# Patient Record
Sex: Male | Born: 1955 | Race: White | Hispanic: No | Smoking: Light tobacco smoker
Health system: Southern US, Community
[De-identification: ages and names within clinical notes are randomized; demographics above are authoritative.]

## PROBLEM LIST (undated history)

## (undated) DIAGNOSIS — R519 Headache, unspecified: Secondary | ICD-10-CM

## (undated) DIAGNOSIS — R809 Proteinuria, unspecified: Secondary | ICD-10-CM

## (undated) DIAGNOSIS — F32A Depression, unspecified: Secondary | ICD-10-CM

## (undated) DIAGNOSIS — R51 Headache: Secondary | ICD-10-CM

## (undated) DIAGNOSIS — T8859XA Other complications of anesthesia, initial encounter: Secondary | ICD-10-CM

## (undated) DIAGNOSIS — M1990 Unspecified osteoarthritis, unspecified site: Secondary | ICD-10-CM

## (undated) DIAGNOSIS — E785 Hyperlipidemia, unspecified: Secondary | ICD-10-CM

## (undated) DIAGNOSIS — J349 Unspecified disorder of nose and nasal sinuses: Secondary | ICD-10-CM

## (undated) DIAGNOSIS — D472 Monoclonal gammopathy: Secondary | ICD-10-CM

## (undated) DIAGNOSIS — T4145XA Adverse effect of unspecified anesthetic, initial encounter: Secondary | ICD-10-CM

## (undated) DIAGNOSIS — M47816 Spondylosis without myelopathy or radiculopathy, lumbar region: Secondary | ICD-10-CM

## (undated) DIAGNOSIS — I10 Essential (primary) hypertension: Secondary | ICD-10-CM

## (undated) DIAGNOSIS — K219 Gastro-esophageal reflux disease without esophagitis: Secondary | ICD-10-CM

## (undated) DIAGNOSIS — H9190 Unspecified hearing loss, unspecified ear: Secondary | ICD-10-CM

## (undated) DIAGNOSIS — M47812 Spondylosis without myelopathy or radiculopathy, cervical region: Secondary | ICD-10-CM

## (undated) DIAGNOSIS — R112 Nausea with vomiting, unspecified: Secondary | ICD-10-CM

## (undated) DIAGNOSIS — C801 Malignant (primary) neoplasm, unspecified: Secondary | ICD-10-CM

## (undated) DIAGNOSIS — E119 Type 2 diabetes mellitus without complications: Secondary | ICD-10-CM

## (undated) DIAGNOSIS — G4730 Sleep apnea, unspecified: Secondary | ICD-10-CM

## (undated) DIAGNOSIS — Z9889 Other specified postprocedural states: Secondary | ICD-10-CM

## (undated) HISTORY — PX: ANKLE ARTHROSCOPY: SUR85

## (undated) HISTORY — PX: TONSILLECTOMY: SUR1361

## (undated) HISTORY — PX: HERNIA REPAIR: SHX51

## (undated) HISTORY — PX: EYE SURGERY: SHX253

## (undated) HISTORY — DX: Monoclonal gammopathy: D47.2

## (undated) HISTORY — PX: PLANTAR FASCIECTOMY: SUR600

## (undated) HISTORY — PX: SINUSOTOMY: SHX291

---

## 1998-05-27 ENCOUNTER — Ambulatory Visit: Admission: RE | Admit: 1998-05-27 | Discharge: 1998-05-27 | Payer: Self-pay | Admitting: Otolaryngology

## 2005-01-16 ENCOUNTER — Ambulatory Visit: Payer: Self-pay | Admitting: General Surgery

## 2005-02-07 ENCOUNTER — Ambulatory Visit: Payer: Self-pay | Admitting: General Surgery

## 2007-10-30 ENCOUNTER — Ambulatory Visit: Payer: Self-pay | Admitting: Internal Medicine

## 2008-06-29 ENCOUNTER — Emergency Department: Payer: Self-pay | Admitting: Internal Medicine

## 2008-06-29 IMAGING — CR DG WRIST COMPLETE 3+V*R*
1 series · 4 of 4 positions shown · non-contrast
Comparison: None

REASON FOR EXAM: Laceration with Small particle of glass
COMMENTS:

PROCEDURE:     DXR - DXR WRIST RT COMP WITH OBLIQUES  - [DATE]  [DATE]
RESULT:     History: Laceration

[Series 1: view not recorded · 0.17mm/px · 4 of 4 slices shown]
[im 1/4]
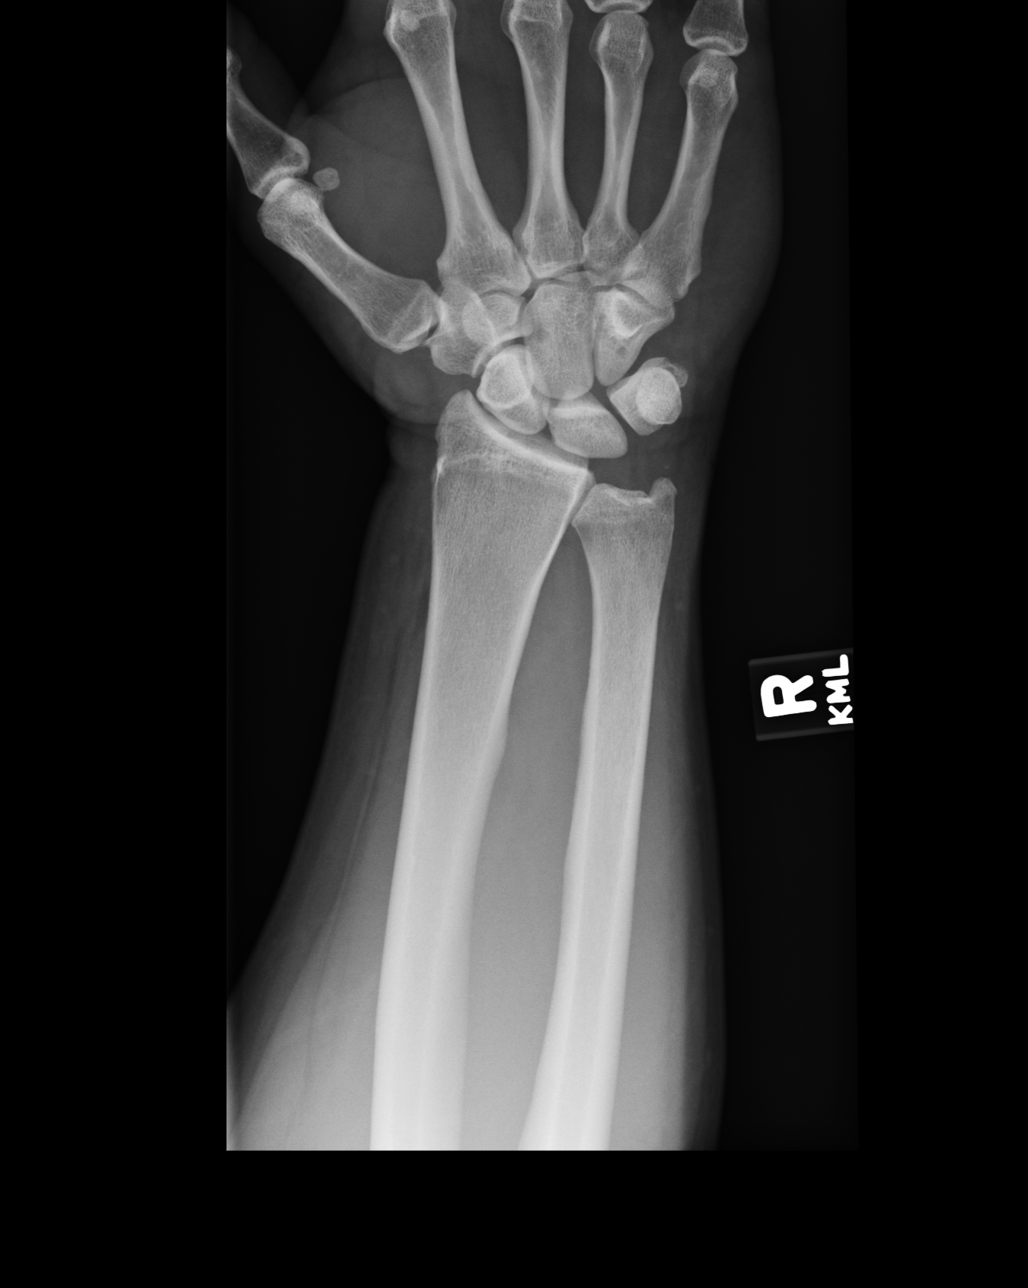
[im 2/4]
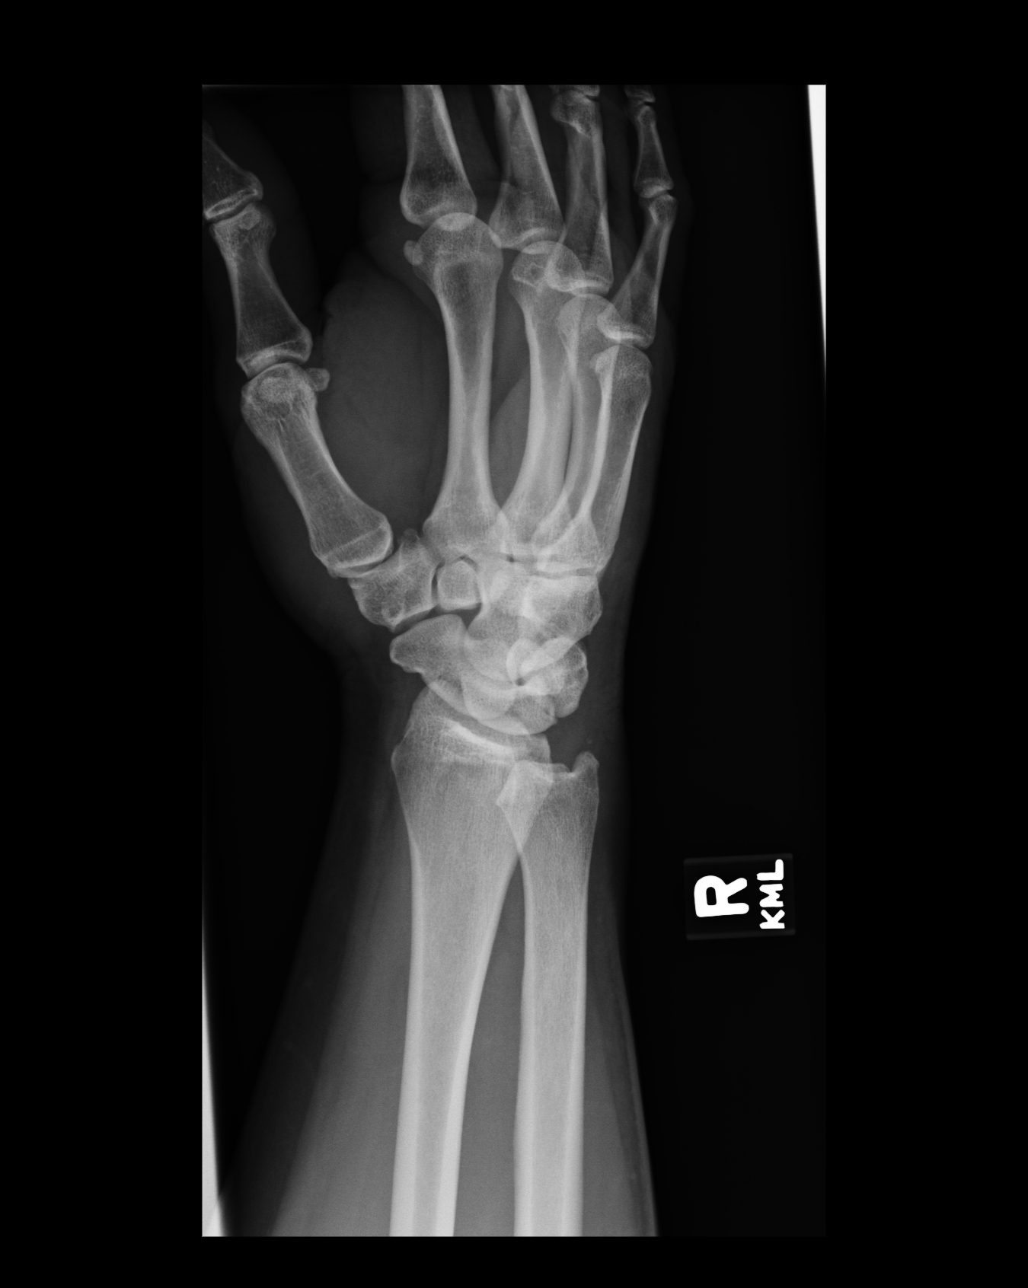
[im 3/4]
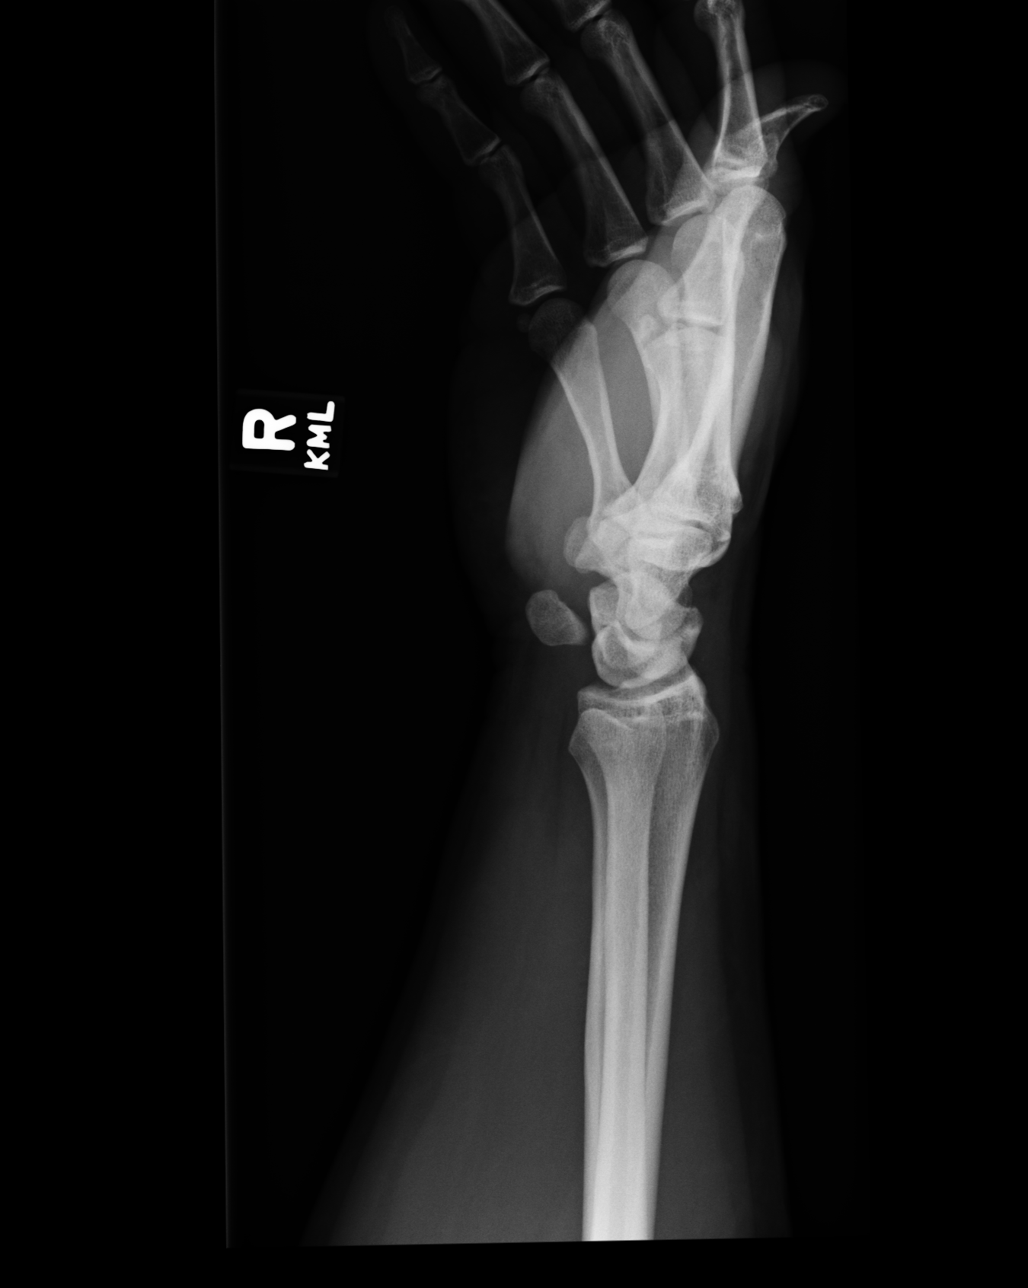
[im 4/4]
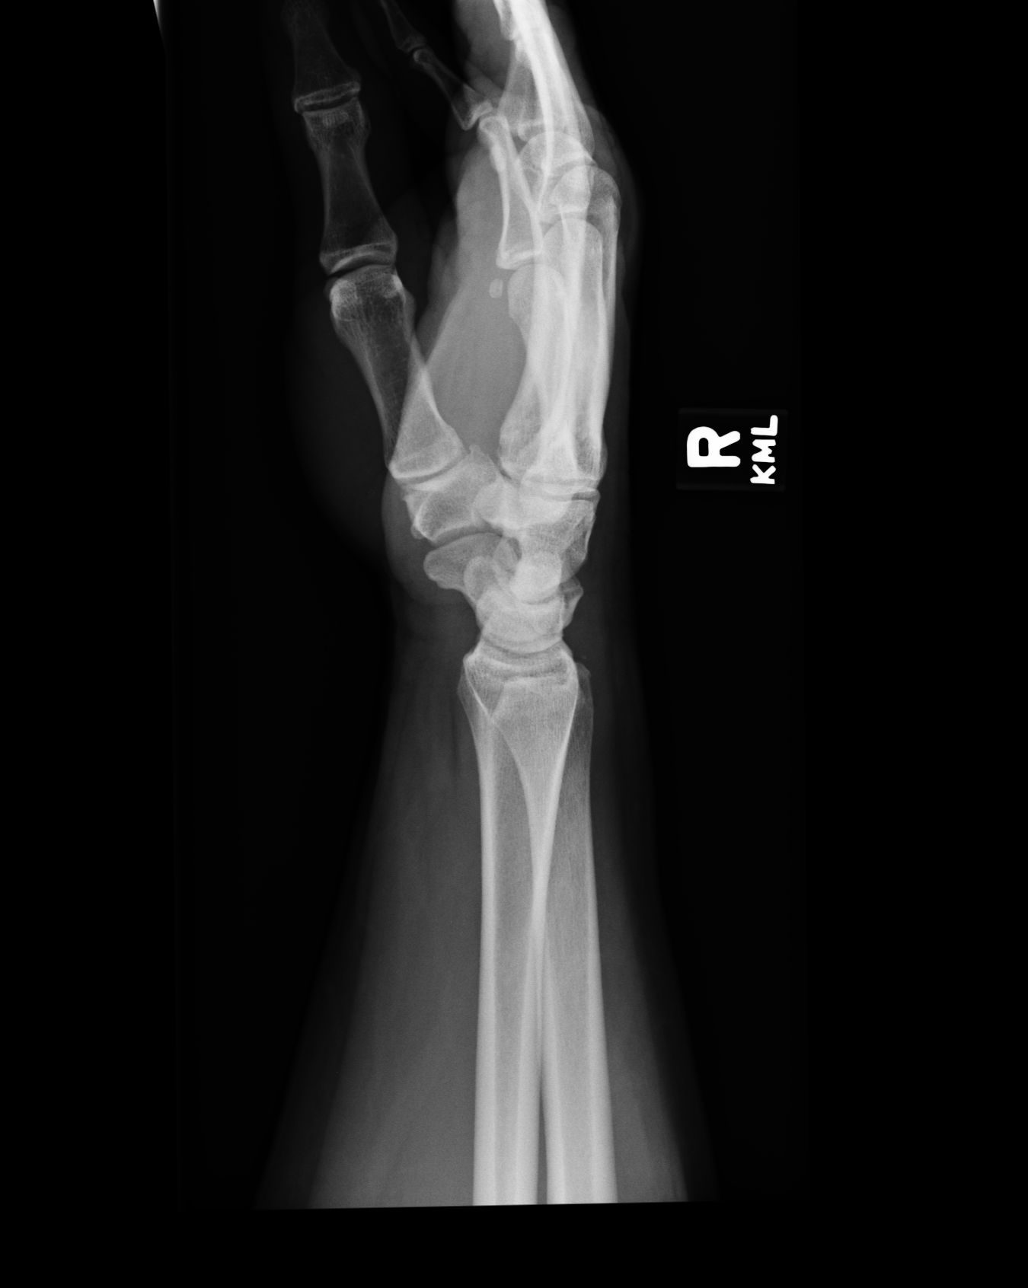

[4 of 4 positions shown; findings below may reference images not displayed]

FINDINGS: 4 views of the right wrist demonstrate no acute fracture or dislocation. The
joint spaces are maintained. The soft tissues appear normal.
IMPRESSION: No acute osseous abnormality of the right wrist.

If there is clinical concern regarding a radiographically occult scaphoid
fracture, snuff box tenderness, or ligamentous injury, further assessment
with MRI is recommended.

## 2009-12-10 ENCOUNTER — Ambulatory Visit: Payer: Self-pay | Admitting: Unknown Physician Specialty

## 2009-12-21 ENCOUNTER — Ambulatory Visit: Payer: Self-pay | Admitting: Unknown Physician Specialty

## 2010-01-21 HISTORY — PX: SINUSOTOMY: SHX291

## 2011-01-19 ENCOUNTER — Ambulatory Visit: Payer: Self-pay | Admitting: Gastroenterology

## 2011-01-22 HISTORY — PX: COLON SURGERY: SHX602

## 2011-02-01 ENCOUNTER — Ambulatory Visit: Payer: Self-pay | Admitting: Surgery

## 2011-02-01 DIAGNOSIS — I10 Essential (primary) hypertension: Secondary | ICD-10-CM

## 2011-02-07 ENCOUNTER — Inpatient Hospital Stay: Payer: Self-pay | Admitting: Surgery

## 2011-02-08 DIAGNOSIS — C189 Malignant neoplasm of colon, unspecified: Secondary | ICD-10-CM | POA: Insufficient documentation

## 2011-02-21 ENCOUNTER — Ambulatory Visit: Payer: Self-pay | Admitting: Internal Medicine

## 2011-02-24 LAB — CEA: CEA: 2.9 ng/mL (ref 0.0–4.7)

## 2011-02-27 LAB — PATHOLOGY REPORT

## 2011-02-28 IMAGING — CT CT CHEST-ABD-PELV W/ CM
1 of 2 series · 14 of 32 positions shown, 18 images · non-contrast
Comparison: none

REASON FOR EXAM: inital staging colon CA
COMMENTS:

[Series 2: ch-ab-pel w 5.0 i40f 3 · axial · 0.98mm/px · z∈[-324,+320]mm · 14 of 143 slices shown, 18 images]
[im 7/143  soft-tissue]
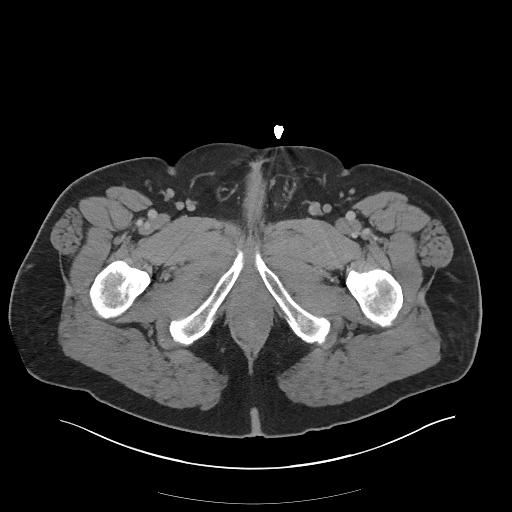
[im 7/143  bone]
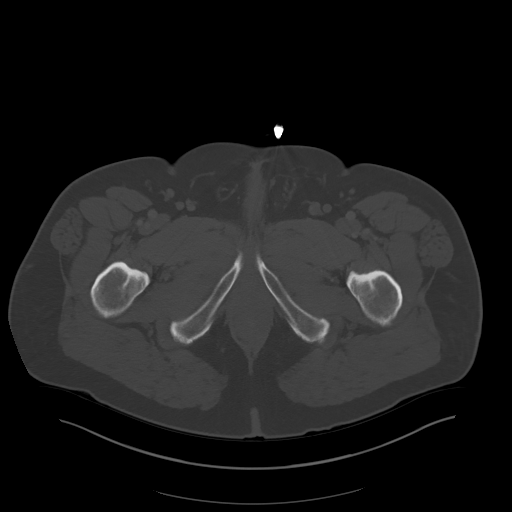
[im 21/143  soft-tissue]
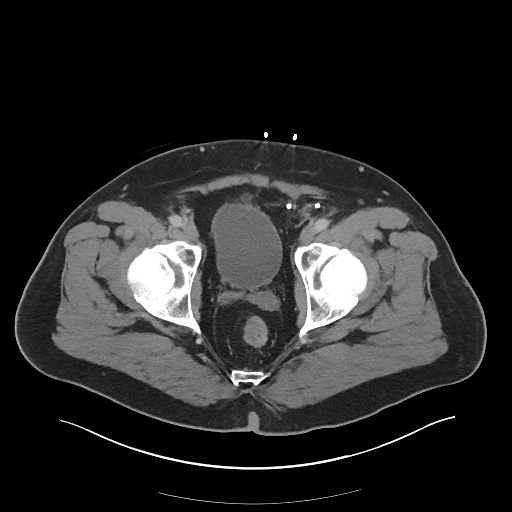
[im 34/143  soft-tissue]
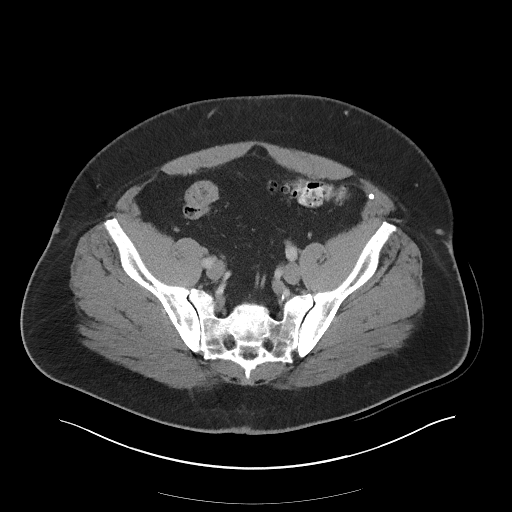
[im 41/143  soft-tissue]
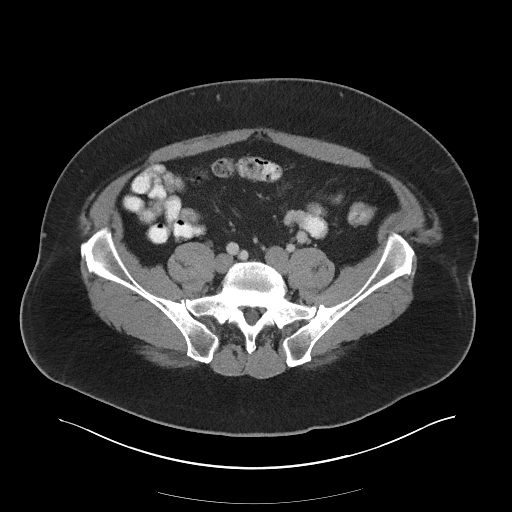
[im 55/143  soft-tissue]
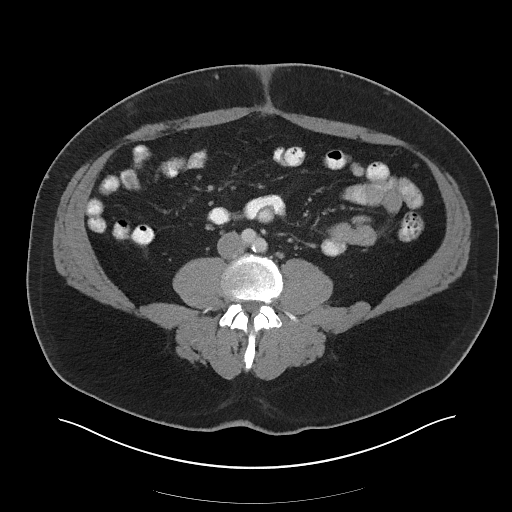
[im 68/143  soft-tissue]
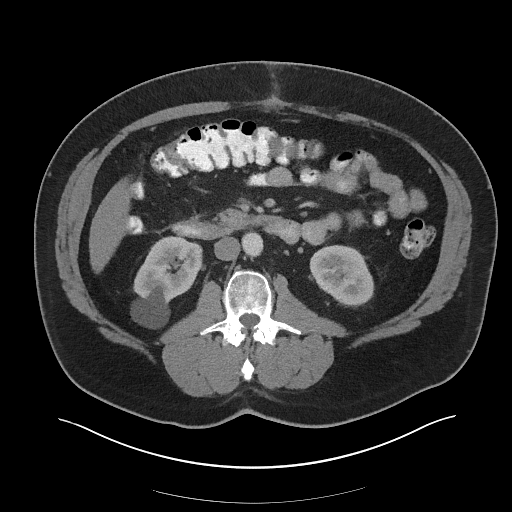
[im 75/143  soft-tissue]
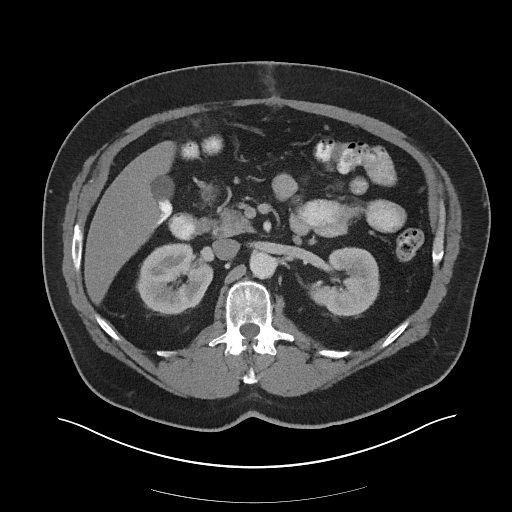
[im 88/143  soft-tissue]
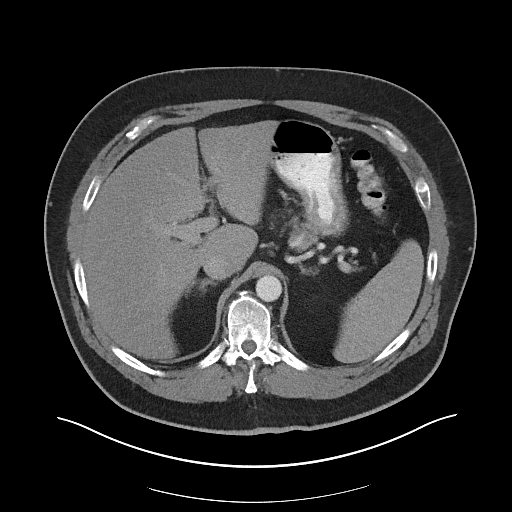
[im 102/143  soft-tissue]
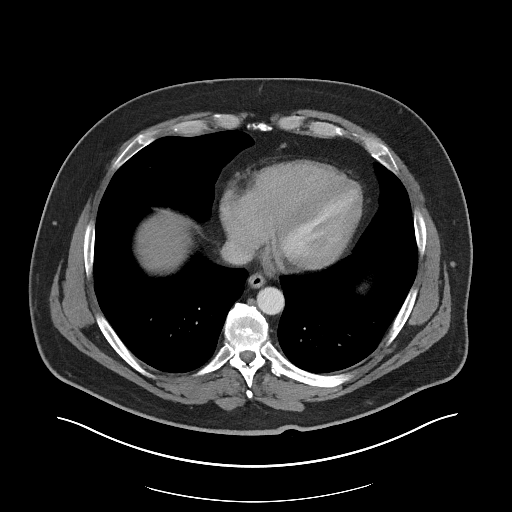
[im 102/143  bone]
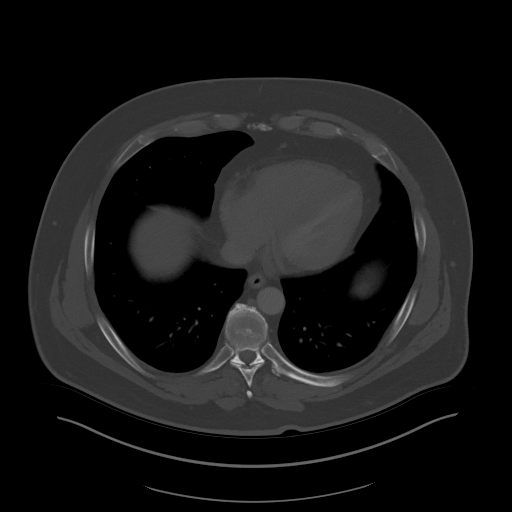
[im 109/143  soft-tissue]
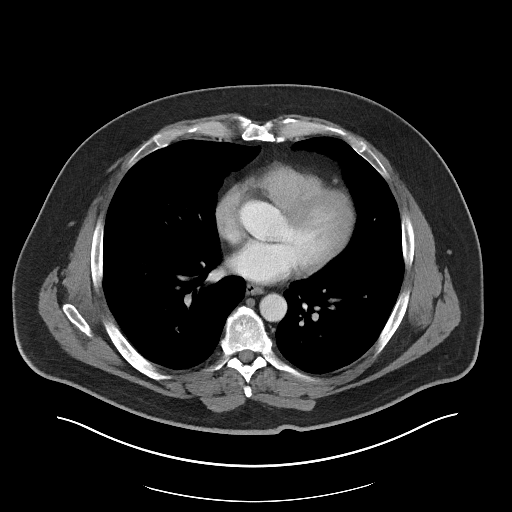
[im 115/143  lung]
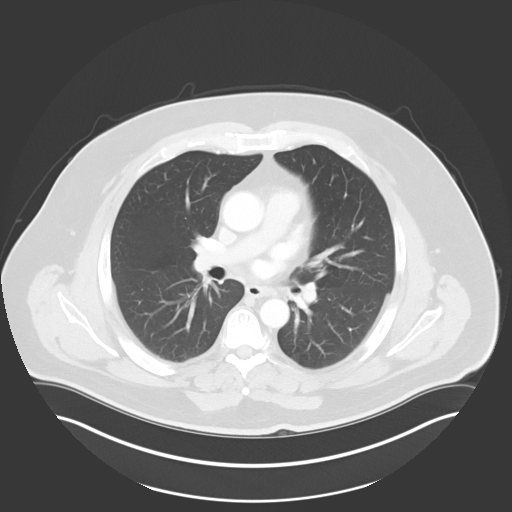
[im 122/143  soft-tissue]
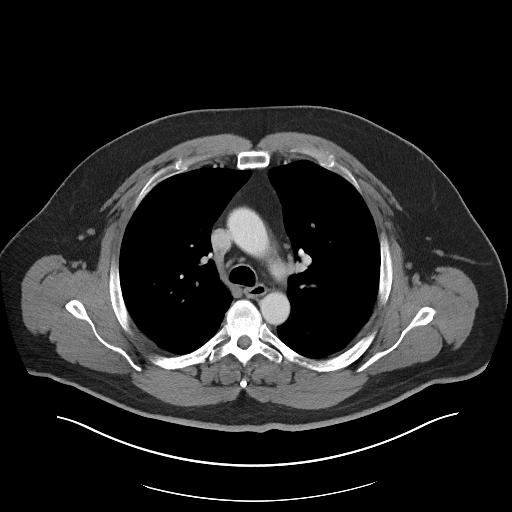
[im 122/143  lung]
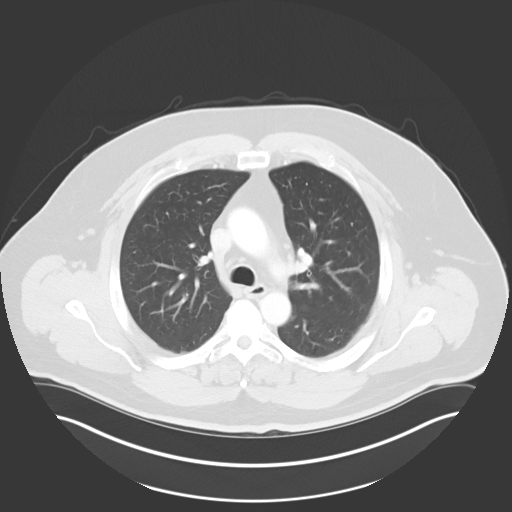
[im 129/143  lung]
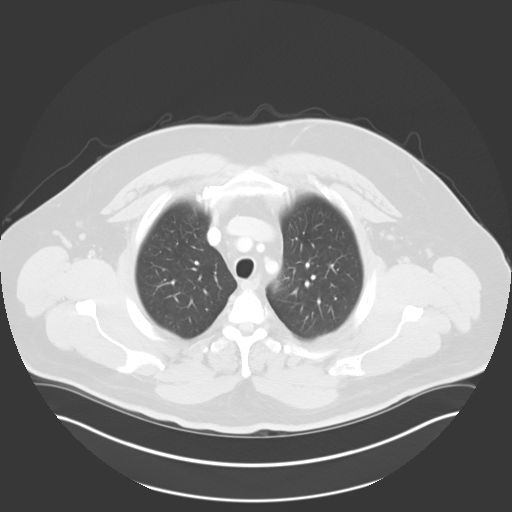
[im 136/143  soft-tissue]
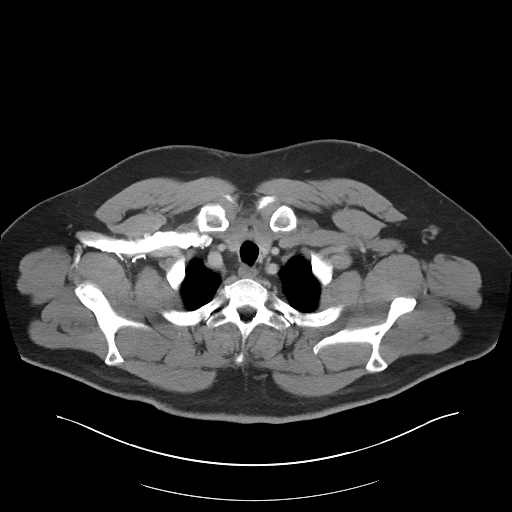
[im 136/143  lung]
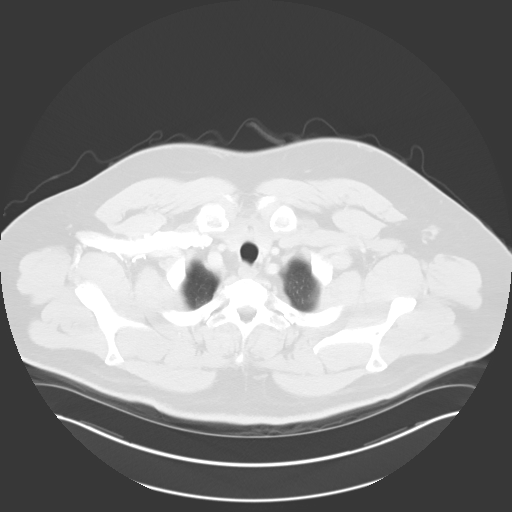

[14 of 32 positions shown; findings below may reference images not displayed]

PROCEDURE:     KCT - KCT CHEST ABDOMEN AND PELVIS W  - [DATE]  [DATE]

RESULT:     Axial CT scanning was performed through the chest, abdomen, and
pelvis at 5 mm intervals and slice thicknesses. The patient received 100 cc
of [IH] as well as oral contrast material. Review of multiplanar
reconstructed images was performed separately on the VIA monitor.

CT scan of the chest: At lung window settings I see no interstitial nor
alveolar infiltrates. No pulmonary parenchymal nodules or masses are
demonstrated. And mediastinal window settings the cardiac chambers are
normal in size. The caliber of the thoracic aorta is normal. I see no
pathologic sized mediastinal or hilar or axillary lymph nodes. The thyroid
lobes are normal in density and symmetric in size. The trachea is midline.
The thoracic esophagus is normal in appearance. There is no pleural nor
pericardial effusion. The thoracic vertebral bodies are preserved in height.
No lytic or blastic rib lesion is identified.
CONCLUSION: I see no finding to suggest metastatic disease the thorax.

CT scan of the abdomen and pelvis: The liver exhibits normal density with no
focal mass nor ductal dilation. The gallbladder is adequately distended with
no evidence of calcified stones or wall thickening or pericholecystic fluid.
The pancreas, spleen, partially distended stomach, adrenal glands, and
kidneys exhibit no acute abnormality. The right kidney exhibits a 1 cm
diameter cortically based anterior upper pole hypodensity consistent with a
cyst. Exophytic from the posterior aspect of the midpole is an approximately
3 0.9 cm diameter hypodensity with Hounsfield measurement of -11 most
compatible with a cyst.

The caliber of the abdominal aorta is normal. The periaortic and pericaval
regions exhibit no of lymphadenopathy. The partially contrast-filled loops
of small and large bowel exhibit no acute abnormality. There is no evidence
of bowel obstruction or ileus. There are surgical clips in the right mid
abdomen and evidence of previous right colectomy. There is a tiny umbilical
hernia containing only fat. The sigmoid colon contains none of the orally
administered contrast, but it is grossly normal. The patient has undergone
previous left inguinal hernia repair. The urinary bladder and prostate gland
and seminal vesicles are normal in appearance. The lumbar vertebral bodies
are preserved in height. There is disc space narrowing at L4-L5. The bony
pelvis exhibits no lytic or blastic lesion.
IMPRESSION: 1. I see no finding to suggest metastatic disease to the thorax.
2. I see no finding suspicious for direct extension of colonic malignancy
nor of metastatic disease.
3. There are hypodensities associated with the right kidney most compatible
with cysts.

## 2011-03-24 ENCOUNTER — Ambulatory Visit: Payer: Self-pay | Admitting: Internal Medicine

## 2011-07-04 ENCOUNTER — Ambulatory Visit: Payer: Self-pay | Admitting: Internal Medicine

## 2011-07-24 ENCOUNTER — Ambulatory Visit: Payer: Self-pay | Admitting: Internal Medicine

## 2011-11-03 ENCOUNTER — Ambulatory Visit: Payer: Self-pay | Admitting: Internal Medicine

## 2011-11-03 LAB — COMPREHENSIVE METABOLIC PANEL
Anion Gap: 8 (ref 7–16)
Calcium, Total: 8.8 mg/dL (ref 8.5–10.1)
Chloride: 103 mmol/L (ref 98–107)
Co2: 29 mmol/L (ref 21–32)
EGFR (African American): >60
EGFR (Non-African Amer.): >60
Osmolality: 284 (ref 275–301)
Potassium: 3.9 mmol/L (ref 3.5–5.1)
Sodium: 140 mmol/L (ref 136–145)

## 2011-11-03 LAB — CBC CANCER CENTER
Basophil %: 0.4 %
Eosinophil #: 0.2 x10 3/mm (ref 0.0–0.7)
Eosinophil %: 2.4 %
HCT: 45.2 % (ref 40.0–52.0)
HGB: 15.7 g/dL (ref 13.0–18.0)
Lymphocyte %: 32 %
MCH: 33.1 pg (ref 26.0–34.0)
Neutrophil #: 3.7 x10 3/mm (ref 1.4–6.5)
Neutrophil %: 54.9 %
RBC: 4.75 10*6/uL (ref 4.40–5.90)

## 2011-11-24 ENCOUNTER — Ambulatory Visit: Payer: Self-pay | Admitting: Internal Medicine

## 2012-03-01 ENCOUNTER — Ambulatory Visit: Payer: Self-pay | Admitting: Oncology

## 2012-03-01 LAB — CBC CANCER CENTER
Eosinophil #: 0.2 x10 3/mm (ref 0.0–0.7)
HCT: 46.6 % (ref 40.0–52.0)
HGB: 15.7 g/dL (ref 13.0–18.0)
Lymphocyte #: 2.2 x10 3/mm (ref 1.0–3.6)
Lymphocyte %: 31.4 %
MCHC: 33.7 g/dL (ref 32.0–36.0)
MCV: 96 fL (ref 80–100)
Monocyte %: 10.8 %
Neutrophil %: 54.4 %
Platelet: 187 x10 3/mm (ref 150–440)
RDW: 12.8 % (ref 11.5–14.5)
WBC: 7 x10 3/mm (ref 3.8–10.6)

## 2012-03-01 LAB — COMPREHENSIVE METABOLIC PANEL
Albumin: 3.9 g/dL (ref 3.4–5.0)
Calcium, Total: 8.9 mg/dL (ref 8.5–10.1)
Co2: 28 mmol/L (ref 21–32)
EGFR (African American): >60
EGFR (Non-African Amer.): >60
Glucose: 166 mg/dL — ABNORMAL HIGH (ref 65–99)
Osmolality: 286 (ref 275–301)
Potassium: 3.4 mmol/L — ABNORMAL LOW (ref 3.5–5.1)
SGOT(AST): 31 U/L (ref 15–37)
SGPT (ALT): 51 U/L
Sodium: 140 mmol/L (ref 136–145)

## 2012-03-03 LAB — CEA: CEA: 3.2 ng/mL (ref 0.0–4.7)

## 2012-03-23 ENCOUNTER — Ambulatory Visit: Payer: Self-pay | Admitting: Oncology

## 2012-05-16 ENCOUNTER — Ambulatory Visit: Payer: Self-pay | Admitting: Gastroenterology

## 2012-05-20 LAB — PATHOLOGY REPORT

## 2012-07-05 ENCOUNTER — Ambulatory Visit: Payer: Self-pay | Admitting: Oncology

## 2012-07-05 LAB — CBC CANCER CENTER
Basophil #: 0.1 x10 3/mm (ref 0.0–0.1)
Basophil %: 0.6 %
Eosinophil #: 0.1 x10 3/mm (ref 0.0–0.7)
Eosinophil %: 1.6 %
HCT: 45.6 % (ref 40.0–52.0)
HGB: 15.1 g/dL (ref 13.0–18.0)
Lymphocyte %: 26.1 %
MCH: 31.9 pg (ref 26.0–34.0)
MCHC: 33.1 g/dL (ref 32.0–36.0)
MCV: 97 fL (ref 80–100)
Monocyte #: 0.8 x10 3/mm (ref 0.2–1.0)
Neutrophil #: 5.7 x10 3/mm (ref 1.4–6.5)
Neutrophil %: 62.5 %
RBC: 4.72 10*6/uL (ref 4.40–5.90)
RDW: 12.7 % (ref 11.5–14.5)
WBC: 9.1 x10 3/mm (ref 3.8–10.6)

## 2012-07-23 ENCOUNTER — Ambulatory Visit: Payer: Self-pay | Admitting: Oncology

## 2013-01-06 ENCOUNTER — Ambulatory Visit: Payer: Self-pay | Admitting: Oncology

## 2013-01-16 LAB — CBC CANCER CENTER
Basophil #: 0.1 "x10 3/mm "
Basophil %: 0.8 %
Eosinophil #: 0.2 "x10 3/mm "
Eosinophil %: 2.2 %
HCT: 44.4 %
HGB: 15.3 g/dL
Lymphocyte %: 32.6 %
Lymphs Abs: 2.7 "x10 3/mm "
MCH: 32.2 pg
MCHC: 34.5 g/dL
MCV: 94 fL
Monocyte #: 0.8 "x10 3/mm "
Monocyte %: 9.2 %
Neutrophil #: 4.5 "x10 3/mm "
Neutrophil %: 55.2 %
Platelet: 174 "x10 3/mm "
RBC: 4.75 "x10 6/mm "
RDW: 12.8 %
WBC: 8.2 "x10 3/mm "

## 2013-01-17 LAB — CEA: CEA: 2.9 ng/mL (ref 0.0–4.7)

## 2013-01-21 ENCOUNTER — Ambulatory Visit: Payer: Self-pay | Admitting: Oncology

## 2013-07-10 ENCOUNTER — Ambulatory Visit: Payer: Self-pay | Admitting: Oncology

## 2013-07-10 LAB — CBC CANCER CENTER
Basophil #: 0.1 x10 3/mm (ref 0.0–0.1)
Eosinophil #: 0.2 x10 3/mm (ref 0.0–0.7)
Eosinophil %: 1.9 %
HCT: 46.4 % (ref 40.0–52.0)
HGB: 15.8 g/dL (ref 13.0–18.0)
MCH: 31.9 pg (ref 26.0–34.0)
MCHC: 34.1 g/dL (ref 32.0–36.0)
Monocyte #: 1.1 x10 3/mm — ABNORMAL HIGH (ref 0.2–1.0)
Neutrophil #: 5.5 x10 3/mm (ref 1.4–6.5)
Neutrophil %: 55.9 %
RBC: 4.96 10*6/uL (ref 4.40–5.90)
RDW: 12.8 % (ref 11.5–14.5)
WBC: 9.9 x10 3/mm (ref 3.8–10.6)

## 2013-07-23 ENCOUNTER — Ambulatory Visit: Payer: Self-pay | Admitting: Oncology

## 2014-01-15 ENCOUNTER — Ambulatory Visit: Payer: Self-pay | Admitting: Oncology

## 2014-01-15 LAB — CBC CANCER CENTER
BASOS PCT: 1 %
Basophil #: 0.1 x10 3/mm (ref 0.0–0.1)
EOS PCT: 1.9 %
Eosinophil #: 0.2 x10 3/mm (ref 0.0–0.7)
HCT: 47.6 % (ref 40.0–52.0)
HGB: 15.9 g/dL (ref 13.0–18.0)
Lymphocyte #: 2.8 x10 3/mm (ref 1.0–3.6)
Lymphocyte %: 27.2 %
MCH: 31.4 pg (ref 26.0–34.0)
MCHC: 33.4 g/dL (ref 32.0–36.0)
MCV: 94 fL (ref 80–100)
Monocyte #: 1.1 x10 3/mm — ABNORMAL HIGH (ref 0.2–1.0)
Monocyte %: 10.4 %
NEUTROS ABS: 6.2 x10 3/mm (ref 1.4–6.5)
Neutrophil %: 59.5 %
Platelet: 205 x10 3/mm (ref 150–440)
RBC: 5.07 10*6/uL (ref 4.40–5.90)
RDW: 12.8 % (ref 11.5–14.5)
WBC: 10.4 x10 3/mm (ref 3.8–10.6)

## 2014-01-16 LAB — CEA: CEA: 2.9 ng/mL (ref 0.0–4.7)

## 2014-01-21 ENCOUNTER — Ambulatory Visit: Payer: Self-pay | Admitting: Oncology

## 2014-05-11 DIAGNOSIS — R809 Proteinuria, unspecified: Secondary | ICD-10-CM | POA: Insufficient documentation

## 2014-07-10 ENCOUNTER — Ambulatory Visit: Payer: Self-pay | Admitting: Oncology

## 2014-07-10 LAB — CBC CANCER CENTER
Basophil #: 0.1 x10 3/mm (ref 0.0–0.1)
Basophil %: 0.7 %
EOS ABS: 0.2 x10 3/mm (ref 0.0–0.7)
Eosinophil %: 2.2 %
HCT: 46.9 % (ref 40.0–52.0)
HGB: 15.9 g/dL (ref 13.0–18.0)
LYMPHS PCT: 33.6 %
Lymphocyte #: 3.5 x10 3/mm (ref 1.0–3.6)
MCH: 32 pg (ref 26.0–34.0)
MCHC: 33.8 g/dL (ref 32.0–36.0)
MCV: 95 fL (ref 80–100)
MONO ABS: 0.9 x10 3/mm (ref 0.2–1.0)
Monocyte %: 8.9 %
NEUTROS ABS: 5.7 x10 3/mm (ref 1.4–6.5)
Neutrophil %: 54.6 %
PLATELETS: 193 x10 3/mm (ref 150–440)
RBC: 4.95 10*6/uL (ref 4.40–5.90)
RDW: 12.8 % (ref 11.5–14.5)
WBC: 10.4 x10 3/mm (ref 3.8–10.6)

## 2014-07-14 LAB — CEA: CEA: 2.9 ng/mL (ref 0.0–4.7)

## 2014-07-23 ENCOUNTER — Ambulatory Visit: Payer: Self-pay | Admitting: Oncology

## 2014-09-11 DIAGNOSIS — IMO0002 Reserved for concepts with insufficient information to code with codable children: Secondary | ICD-10-CM | POA: Insufficient documentation

## 2014-09-11 DIAGNOSIS — E1129 Type 2 diabetes mellitus with other diabetic kidney complication: Secondary | ICD-10-CM | POA: Insufficient documentation

## 2015-01-08 ENCOUNTER — Ambulatory Visit
Admit: 2015-01-08 | Disposition: A | Discharge status: Home / Self Care | Payer: Self-pay | Attending: Oncology | Admitting: Oncology

## 2015-01-22 ENCOUNTER — Ambulatory Visit
Admit: 2015-01-22 | Disposition: A | Discharge status: Home / Self Care | Payer: Self-pay | Attending: Oncology | Admitting: Oncology

## 2015-05-13 ENCOUNTER — Encounter: Payer: Self-pay | Admitting: *Deleted

## 2015-05-14 ENCOUNTER — Encounter: Payer: Self-pay | Admitting: Certified Registered Nurse Anesthetist

## 2015-05-14 ENCOUNTER — Ambulatory Visit
Admission: RE | Admit: 2015-05-14 | Discharge: 2015-05-14 | Disposition: A | Discharge status: Home / Self Care | Payer: PRIVATE HEALTH INSURANCE | Source: Ambulatory Visit | Attending: Gastroenterology | Admitting: Gastroenterology

## 2015-05-14 ENCOUNTER — Encounter
Admission: RE | Disposition: A | Discharge status: Home / Self Care | Payer: Self-pay | Source: Ambulatory Visit | Attending: Gastroenterology

## 2015-05-14 DIAGNOSIS — G4730 Sleep apnea, unspecified: Secondary | ICD-10-CM | POA: Insufficient documentation

## 2015-05-14 DIAGNOSIS — Z79899 Other long term (current) drug therapy: Secondary | ICD-10-CM | POA: Insufficient documentation

## 2015-05-14 DIAGNOSIS — E119 Type 2 diabetes mellitus without complications: Secondary | ICD-10-CM | POA: Diagnosis not present

## 2015-05-14 DIAGNOSIS — Z85038 Personal history of other malignant neoplasm of large intestine: Secondary | ICD-10-CM | POA: Insufficient documentation

## 2015-05-14 DIAGNOSIS — I10 Essential (primary) hypertension: Secondary | ICD-10-CM | POA: Insufficient documentation

## 2015-05-14 DIAGNOSIS — K5730 Diverticulosis of large intestine without perforation or abscess without bleeding: Secondary | ICD-10-CM | POA: Diagnosis not present

## 2015-05-14 DIAGNOSIS — Z08 Encounter for follow-up examination after completed treatment for malignant neoplasm: Secondary | ICD-10-CM | POA: Insufficient documentation

## 2015-05-14 DIAGNOSIS — E785 Hyperlipidemia, unspecified: Secondary | ICD-10-CM | POA: Insufficient documentation

## 2015-05-14 HISTORY — PX: COLONOSCOPY WITH PROPOFOL: SHX5780

## 2015-05-14 HISTORY — DX: Type 2 diabetes mellitus without complications: E11.9

## 2015-05-14 HISTORY — DX: Headache, unspecified: R51.9

## 2015-05-14 HISTORY — DX: Headache: R51

## 2015-05-14 HISTORY — DX: Malignant (primary) neoplasm, unspecified: C80.1

## 2015-05-14 HISTORY — DX: Sleep apnea, unspecified: G47.30

## 2015-05-14 HISTORY — DX: Essential (primary) hypertension: I10

## 2015-05-14 HISTORY — DX: Hyperlipidemia, unspecified: E78.5

## 2015-05-14 LAB — GLUCOSE, CAPILLARY: Glucose-Capillary: 128 mg/dL — ABNORMAL HIGH (ref 65–99)

## 2015-05-14 SURGERY — COLONOSCOPY WITH PROPOFOL
Anesthesia: General

## 2015-05-14 MED ORDER — PROMETHAZINE HCL 25 MG/ML IJ SOLN
INTRAMUSCULAR | Status: DC | PRN
Start: 2015-05-14 — End: 2015-05-14
  Administered 2015-05-14: 12.5 mg via INTRAVENOUS

## 2015-05-14 MED ORDER — FENTANYL CITRATE (PF) 100 MCG/2ML IJ SOLN
INTRAMUSCULAR | Status: DC | PRN
  Administered 2015-05-14 (×2): 50 ug via INTRAVENOUS

## 2015-05-14 MED ORDER — PROMETHAZINE HCL 25 MG/ML IJ SOLN
INTRAMUSCULAR | Status: AC
  Filled 2015-05-14: qty 1

## 2015-05-14 MED ORDER — SODIUM CHLORIDE 0.9 % IV SOLN: INTRAVENOUS | Status: DC

## 2015-05-14 MED ORDER — MIDAZOLAM HCL 5 MG/5ML IJ SOLN
INTRAMUSCULAR | Status: AC
  Filled 2015-05-14: qty 5

## 2015-05-14 MED ORDER — FENTANYL CITRATE (PF) 100 MCG/2ML IJ SOLN
INTRAMUSCULAR | Status: AC
  Filled 2015-05-14: qty 2

## 2015-05-14 MED ORDER — SODIUM CHLORIDE 0.9 % IV SOLN
INTRAVENOUS | Status: DC
  Administered 2015-05-14: 1000 mL via INTRAVENOUS

## 2015-05-14 MED ORDER — MIDAZOLAM HCL 5 MG/ML IJ SOLN
INTRAMUSCULAR | Status: DC | PRN
  Administered 2015-05-14 (×2): 2 mg via INTRAVENOUS

## 2015-05-14 NOTE — H&P (Signed)
    Primary Care Physician:  Adin Hector, MD Primary Gastroenterologist:  Dr. Candace Cruise  Pre-Procedure History & Physical: HPI:  Stuart Rasmussen is a 59 y.o. male is here for an colonoscopy.  Past Medical History  Diagnosis Date  . Hypertension   . Hyperlipidemia   . Diabetes mellitus without complication   . Sleep apnea   . Cancer   . Headache     Past Surgical History  Procedure Laterality Date  . Ankle arthroscopy    . Hernia repair    . Plantar fasciectomy    . Sinusotomy      Prior to Admission medications   Medication Sig Start Date End Date Taking? Authorizing Provider  buPROPion (WELLBUTRIN XL) 150 MG 24 hr tablet Take 150 mg by mouth daily.   Yes Historical Provider, MD  busPIRone (BUSPAR) 15 MG tablet Take 15 mg by mouth 2 times daily at 12 noon and 4 pm.   Yes Historical Provider, MD  cetirizine (ZYRTEC) 10 MG tablet Take 10 mg by mouth daily.   Yes Historical Provider, MD  DULoxetine (CYMBALTA) 30 MG capsule Take 30 mg by mouth daily.   Yes Historical Provider, MD  HYDROcodone-acetaminophen (NORCO) 10-325 MG per tablet Take 1 tablet by mouth every 6 (six) hours as needed.   Yes Historical Provider, MD  losartan (COZAAR) 50 MG tablet Take 50 mg by mouth daily.   Yes Historical Provider, MD  meloxicam (MOBIC) 7.5 MG tablet Take 7.5 mg by mouth daily as needed for pain.   Yes Historical Provider, MD  metFORMIN (GLUCOPHAGE) 500 MG tablet Take by mouth 2 (two) times daily with a meal.   Yes Historical Provider, MD  ranitidine (ZANTAC) 150 MG tablet Take 150 mg by mouth daily.   Yes Historical Provider, MD    Allergies as of 04/13/2015  . (Not on File)    No family history on file.  History   Social History  . Marital Status: Married    Spouse Name: N/A  . Number of Children: N/A  . Years of Education: N/A   Occupational History  . Not on file.   Social History Main Topics  . Smoking status: Not on file  . Smokeless tobacco: Not on file  . Alcohol Use:  Not on file  . Drug Use: Not on file  . Sexual Activity: Not on file   Other Topics Concern  . Not on file   Social History Narrative  . No narrative on file    Review of Systems: See HPI, otherwise negative ROS  Physical Exam: There were no vitals taken for this visit. General:   Alert,  pleasant and cooperative in NAD Head:  Normocephalic and atraumatic. Neck:  Supple; no masses or thyromegaly. Lungs:  Clear throughout to auscultation.    Heart:  Regular rate and rhythm. Abdomen:  Soft, nontender and nondistended. Normal bowel sounds, without guarding, and without rebound.   Neurologic:  Alert and  oriented x4;  grossly normal neurologically.  Impression/Plan: [name] is here for an colonoscopy to be performed for personal hx of colon cancer. Risks, benefits, limitations, and alternatives regarding colonoscopy have been reviewed with the patient.  Questions have been answered.  All parties agreeable.   Ziana Heyliger, Lupita Dawn, MD  05/14/2015, 7:57 AM

## 2015-05-14 NOTE — Op Note (Signed)
[facility] Gastroenterology Patient Name: Stuart Rasmussen Procedure Date: 05/14/2015 9:20 AM MRN: [id number] Account #: [id number] Date of Birth: Feb 26, 1956 Admit Type: Outpatient Age: 59 Room: [facility] ENDO ROOM 4 Gender: Male Note Status: Finalized Procedure:         Colonoscopy Indications:       Personal history of malignant neoplasm of the colon Providers:         Lupita Dawn. Candace Cruise, MD Referring MD:      Ramonita Lab, MD (Referring MD) Medicines:         Midazolam 4 mg IV, Fentanyl 100 micrograms IV,                     Promethazine 44.0 mg IV Complications:     No immediate complications. Procedure:         Pre-Anesthesia Assessment:                    - Prior to the procedure, a History and Physical was                     performed, and patient medications, allergies and                     sensitivities were reviewed. The patient's tolerance of                     previous anesthesia was reviewed.                    - The risks and benefits of the procedure and the sedation                     options and risks were discussed with the patient. All                     questions were answered and informed consent was obtained.                    - After reviewing the risks and benefits, the patient was                     deemed in satisfactory condition to undergo the procedure.                    After obtaining informed consent, the colonoscope was                     passed under direct vision. Throughout the procedure, the                     patient's blood pressure, pulse, and oxygen saturations                     were monitored continuously. The Colonoscope was                     introduced through the anus and advanced to the the                     ileocolonic anastomosis. The colonoscopy was performed                     without difficulty. The patient tolerated the procedure  well. The quality of the bowel preparation was  good. Findings:      A few small-mouthed diverticula were found in the sigmoid colon.       Surgical anastomosis intact.      The exam was otherwise without abnormality. Impression:        - Diverticulosis in the sigmoid colon.                    - The examination was otherwise normal.                    - No specimens collected. Recommendation:    - Discharge patient to home.                    - Repeat colonoscopy in 5 years for surveillance.                    - The findings and recommendations were discussed with the                     patient. Procedure Code(s): --- Professional ---                    [phone], Colonoscopy, flexible; diagnostic, including                     collection of specimen(s) by brushing or washing, when                     performed (separate procedure) Diagnosis Code(s): --- Professional ---                    G29.528, Personal history of other malignant neoplasm of                     large intestine                    K57.30, Diverticulosis of large intestine without                     perforation or abscess without bleeding CPT copyright 2014 American Medical Association. All rights reserved. The codes documented in this report are preliminary and upon coder review may  be revised to meet current compliance requirements. Hulen Luster, MD 05/14/2015 9:55:39 AM This report has been signed electronically. Number of Addenda: 0 Note Initiated On: 05/14/2015 9:20 AM      [facility]

## 2015-05-17 ENCOUNTER — Encounter: Payer: Self-pay | Admitting: Gastroenterology

## 2015-07-14 ENCOUNTER — Inpatient Hospital Stay (HOSPITAL_BASED_OUTPATIENT_CLINIC_OR_DEPARTMENT_OTHER): Payer: PRIVATE HEALTH INSURANCE | Admitting: Oncology

## 2015-07-14 ENCOUNTER — Other Ambulatory Visit: Payer: Self-pay | Admitting: Oncology

## 2015-07-14 ENCOUNTER — Inpatient Hospital Stay: Payer: PRIVATE HEALTH INSURANCE | Attending: Oncology

## 2015-07-14 ENCOUNTER — Encounter: Payer: Self-pay | Admitting: Oncology

## 2015-07-14 VITALS — BP 147/94 | HR 102 | Temp 98.9°F | Resp 18 | Wt 318.1 lb

## 2015-07-14 DIAGNOSIS — E785 Hyperlipidemia, unspecified: Secondary | ICD-10-CM | POA: Diagnosis not present

## 2015-07-14 DIAGNOSIS — I10 Essential (primary) hypertension: Secondary | ICD-10-CM | POA: Diagnosis not present

## 2015-07-14 DIAGNOSIS — E119 Type 2 diabetes mellitus without complications: Secondary | ICD-10-CM

## 2015-07-14 DIAGNOSIS — G4730 Sleep apnea, unspecified: Secondary | ICD-10-CM | POA: Diagnosis not present

## 2015-07-14 DIAGNOSIS — C189 Malignant neoplasm of colon, unspecified: Secondary | ICD-10-CM

## 2015-07-14 DIAGNOSIS — Z79899 Other long term (current) drug therapy: Secondary | ICD-10-CM | POA: Insufficient documentation

## 2015-07-14 DIAGNOSIS — F17210 Nicotine dependence, cigarettes, uncomplicated: Secondary | ICD-10-CM | POA: Diagnosis not present

## 2015-07-14 DIAGNOSIS — D472 Monoclonal gammopathy: Secondary | ICD-10-CM | POA: Diagnosis not present

## 2015-07-14 LAB — CBC WITH DIFFERENTIAL/PLATELET
Basophils Absolute: 0.1 10*3/uL (ref 0–0.1)
Basophils Relative: 1 %
EOS ABS: 0.2 10*3/uL (ref 0–0.7)
EOS PCT: 2 %
HCT: 46.4 % (ref 40.0–52.0)
HEMOGLOBIN: 16 g/dL (ref 13.0–18.0)
Lymphocytes Relative: 29 %
Lymphs Abs: 3.1 10*3/uL (ref 1.0–3.6)
MCH: 32.2 pg (ref 26.0–34.0)
MCHC: 34.5 g/dL (ref 32.0–36.0)
MCV: 93.3 fL (ref 80.0–100.0)
MONOS PCT: 8 %
Monocytes Absolute: 0.9 10*3/uL (ref 0.2–1.0)
Neutro Abs: 6.4 10*3/uL (ref 1.4–6.5)
Neutrophils Relative %: 60 %
PLATELETS: 183 10*3/uL (ref 150–440)
RBC: 4.97 MIL/uL (ref 4.40–5.90)
RDW: 13.1 % (ref 11.5–14.5)
WBC: 10.7 10*3/uL — ABNORMAL HIGH (ref 3.8–10.6)

## 2015-07-14 NOTE — Progress Notes (Signed)
Patient was questioning if he would be qualified "cancer free" for insurance.

## 2015-07-15 LAB — CEA: CEA: 3.3 ng/mL (ref 0.0–4.7)

## 2015-07-22 NOTE — Progress Notes (Signed)
Stuart Rasmussen  [phone]) [phone] Fax:([phone]) [phone]  ID: Stuart Rasmussen OB: Jun 02, 1956  MR#: [id number]  PYP#:[id number]  Patient Care Team: Adin Hector, MD as PCP - General (Internal Medicine)  CHIEF COMPLAINT: Stage I adenocarcinoma the colon.    INTERVAL HISTORY: Patient returns to clinic today for repeat laboratory work and further evaluation.  He continues to feel well and remains asymptomatic.  He denies any recent fevers or illnesses.  He has no neurologic complaints.  He has a good appetite and denies weight loss.  He has noted no change in his bowel movements and denies any melena or hematochezia.  He has no chest pain or shortness of breath.  He has no urinary complaints.  Patient feels at his baseline and offers no specific complaints today.   REVIEW OF SYSTEMS:   Review of Systems  Constitutional: Negative.  Negative for weight loss.  Respiratory: Negative.   Cardiovascular: Negative.   Gastrointestinal: Negative for abdominal pain, diarrhea, constipation, blood in stool and melena.  Musculoskeletal: Negative.   Neurological: Negative.     As per HPI. Otherwise, a complete review of systems is negatve.  PAST MEDICAL HISTORY: Past Medical History  Diagnosis Date  . Hypertension   . Hyperlipidemia   . Diabetes mellitus without complication   . Sleep apnea   . Cancer   . Headache   . MGUS (monoclonal gammopathy of unknown significance)     PAST SURGICAL HISTORY: Past Surgical History  Procedure Laterality Date  . Ankle arthroscopy    . Hernia repair    . Plantar fasciectomy    . Sinusotomy    . Colonoscopy with propofol N/A 05/14/2015    Procedure: COLONOSCOPY WITH PROPOFOL;  Surgeon: Hulen Luster, MD;  Location: [facility] ENDOSCOPY;  Service: Gastroenterology;  Laterality: N/A;    FAMILY HISTORY No family history on file.     ADVANCED DIRECTIVES:    HEALTH MAINTENANCE: Social History  Substance Use Topics  . Smoking status:  Light Tobacco Smoker  . Smokeless tobacco: Never Used  . Alcohol Use: No     Colonoscopy:  PAP:  Bone density:  Lipid panel:  Allergies  Allergen Reactions  . Morphine And Related Nausea Only  . Tetracyclines & Related Swelling    Current Outpatient Prescriptions  Medication Sig Dispense Refill  . buPROPion (WELLBUTRIN XL) 150 MG 24 hr tablet Take 150 mg by mouth daily.    . busPIRone (BUSPAR) 15 MG tablet Take 15 mg by mouth 2 times daily at 12 noon and 4 pm.    . cetirizine (ZYRTEC) 10 MG tablet Take 10 mg by mouth daily.    . Cholecalciferol (VITAMIN D3) 1000 UNITS CAPS Take by mouth.    . DULoxetine (CYMBALTA) 30 MG capsule Take 30 mg by mouth daily.    Marland Kitchen HYDROcodone-acetaminophen (NORCO) 10-325 MG per tablet Take 1 tablet by mouth every 6 (six) hours as needed.    Marland Kitchen losartan (COZAAR) 50 MG tablet Take 50 mg by mouth daily.    . metFORMIN (GLUCOPHAGE) 500 MG tablet Take by mouth 2 (two) times daily with a meal.    . polyethylene glycol powder (GLYCOLAX/MIRALAX) powder Take as directed for colonoscopy prep.    . ranitidine (ZANTAC) 150 MG tablet Take 150 mg by mouth daily.     No current facility-administered medications for this visit.    OBJECTIVE: Filed Vitals:   07/14/15 1455  BP: 147/94  Pulse: 102  Temp: 98.9 F (37.2 C)  Resp: 18     Body mass index is 39.76 kg/(m^2).    ECOG FS:0 - Asymptomatic  General: Well-developed, well-nourished, no acute distress. Eyes: Pink conjunctiva, anicteric sclera. Lungs: Clear to auscultation bilaterally. Heart: Regular rate and rhythm. No rubs, murmurs, or gallops. Abdomen: Soft, nontender, nondistended. No organomegaly noted, normoactive bowel sounds. Musculoskeletal: No edema, cyanosis, or clubbing. Neuro: Alert, answering all questions appropriately. Cranial nerves grossly intact. Skin: No rashes or petechiae noted. Psych: Normal affect.   LAB RESULTS:  Lab Results  Component Value Date   NA 140 03/01/2012   K  3.4* 03/01/2012   CL 102 03/01/2012   CO2 28 03/01/2012   GLUCOSE 166* 03/01/2012   BUN 21* 03/01/2012   CREATININE 1.08 03/01/2012   CALCIUM 8.9 03/01/2012   PROT 7.6 03/01/2012   ALBUMIN 3.9 03/01/2012   AST 31 03/01/2012   ALT 51 03/01/2012   ALKPHOS 72 03/01/2012   BILITOT 0.5 03/01/2012   GFRNONAA >60 03/01/2012   GFRAA >60 03/01/2012    Lab Results  Component Value Date   WBC 10.7* 07/14/2015   NEUTROABS 6.4 07/14/2015   HGB 16.0 07/14/2015   HCT 46.4 07/14/2015   MCV 93.3 07/14/2015   PLT 183 07/14/2015     STUDIES: No results found.  ASSESSMENT: Stage I adenocarcinoma of the colon.  PLAN:    1.  Colon cancer: Given patient's stage of disease, he did not require adjuvant chemotherapy.  Patient had his surgery on February 08, 2011.  His CEA continues to be within normal limits.  His most recent colonoscopy on May 14, 2015 was reported as normal, repeat in 3 years.  Patient is later than 4-1/2 years from his surgery with a normal colonoscopy and normal CEA. He is also say asymptomatic. No further follow-up is necessary. Continue  colonoscopies as scheduled.   Patient expressed understanding and was in agreement with this plan. He also understands that He can call clinic at any time with any questions, concerns, or complaints.     Lloyd Huger, MD   07/22/2015 3:38 PM

## 2015-08-23 ENCOUNTER — Encounter
Admission: RE | Admit: 2015-08-23 | Discharge: 2015-08-23 | Disposition: A | Discharge status: Home / Self Care | Payer: PRIVATE HEALTH INSURANCE | Source: Ambulatory Visit | Attending: Anesthesiology | Admitting: Anesthesiology

## 2015-08-23 ENCOUNTER — Other Ambulatory Visit: Payer: Self-pay

## 2015-08-23 ENCOUNTER — Encounter: Payer: Self-pay | Admitting: *Deleted

## 2015-08-23 DIAGNOSIS — Z01818 Encounter for other preprocedural examination: Secondary | ICD-10-CM | POA: Insufficient documentation

## 2015-08-23 DIAGNOSIS — N433 Hydrocele, unspecified: Secondary | ICD-10-CM | POA: Diagnosis not present

## 2015-08-23 LAB — BASIC METABOLIC PANEL
ANION GAP: 7 (ref 5–15)
BUN: 21 mg/dL — ABNORMAL HIGH (ref 6–20)
CALCIUM: 9.7 mg/dL (ref 8.9–10.3)
CHLORIDE: 105 mmol/L (ref 101–111)
CO2: 26 mmol/L (ref 22–32)
Creatinine, Ser: 0.99 mg/dL (ref 0.61–1.24)
GFR calc non Af Amer: >60 mL/min (ref >60)
Glucose, Bld: 127 mg/dL — ABNORMAL HIGH (ref 65–99)
POTASSIUM: 4.1 mmol/L (ref 3.5–5.1)
Sodium: 138 mmol/L (ref 135–145)

## 2015-08-23 NOTE — Patient Instructions (Signed)
  Your procedure is scheduled on:08/31/15 Report to Day Surgery. [facility] SECOND FLOOR To find out your arrival time please call [phone] between 1PM - 3PM on 08/30/15.  Remember: Instructions that are not followed completely may result in serious medical risk, up to and including death, or upon the discretion of your surgeon and anesthesiologist your surgery may need to be rescheduled.    __X__ 1. Do not eat food or drink liquids after midnight. No gum chewing or hard candies.     _X__ 2. No Alcohol for 24 hours before or after surgery.   ____ 3. Bring all medications with you on the day of surgery if instructed.    _X___ 4. Notify your doctor if there is any change in your medical condition     (cold, fever, infections).     Do not wear jewelry, make-up, hairpins, clips or nail polish.  Do not wear lotions, powders, or perfumes. You may wear deodorant.  Do not shave 48 hours prior to surgery. Men may shave face and neck.  Do not bring valuables to the hospital.    [facility] is not responsible for any belongings or valuables.               Contacts, dentures or bridgework may not be worn into surgery.  Leave your suitcase in the car. After surgery it may be brought to your room.  For patients admitted to the hospital, discharge time is determined by your                treatment team.   Patients discharged the day of surgery will not be allowed to drive home.   Please read over the following fact sheets that you were given:   Surgical Site Infection Prevention   ____ Take these medicines the morning of surgery with A SIP OF WATER:    1. BUSPAR  2. DUOXETINE  3. LOSARTAN   4.RANITIDINE  5.  6.  ____ Fleet Enema (as directed)   __X__ Use CHG Soap as directed  ____ Use inhalers on the day of surgery  __X__ Stop metformin 2 days prior to surgery    ____ Take 1/2 of usual insulin dose the night before surgery and none on the morning of surgery.   ____ Stop  Coumadin/Plavix/aspirin on   ____ Stop Anti-inflammatories on    ____ Stop supplements until after surgery.    _X___ Bring C-Pap to the hospital.

## 2015-08-26 NOTE — H&P (Signed)
NAMEAIDYN, Stuart Rasmussen               ACCOUNT NO.:  [id number]  MEDICAL RECORD NO.:  [id number]  LOCATION:                               FACILITY:  [facility]  PHYSICIAN:  Maryan Puls          DATE OF BIRTH:  1956/04/03  DATE OF ADMISSION:  08/31/2015 DATE OF DISCHARGE:                            HISTORY AND PHYSICAL   SAME-DAY SURGERY:  August 31, 2015.  CHIEF COMPLAINT:  Scrotal swelling.  HISTORY OF PRESENT ILLNESS:  Stuart Rasmussen is a 59 year old Caucasian male with a history of left hydrocele.  Conservative therapy with drainage and sclerotherapy has not been successful.  The patient comes in now for left hydrocelectomy.  ALLERGIES:  THE PATIENT WAS ALLERGIC TO TETRACYCLINES AND MORPHINE.  MEDICATIONS:  Current medications include hydrocodone, Cymbalta, losartan, ranitidine, vitamin D3, Zyrtec, allergy shots, Wellbutrin, and metformin.  PAST SURGICAL HISTORY:  Previous surgical procedures included right ankle surgery and plantar fasciitis surgery in 2014, sinus surgery in 2013, removal of colon cancer in 3419, umbilical herniorrhaphy in 2006.  SOCIAL HISTORY:  The patient smokes a pack a day and has a greater than 20-pack-year history.  He denied alcohol use.  FAMILY HISTORY:  Remarkable for father who died of heart disease.  PAST AND CURRENT MEDICAL CONDITIONS: 1. Hypertension. 2. Diabetes. 3. Sleep apnea.  REVIEW OF SYSTEMS:  The patient denies chest pain, shortness of breath, or stroke.  PHYSICAL EXAMINATION:  GENERAL:  Well-nourished white male, in no acute distress. HEENT:  Sclerae were clear. NECK:  Supple.  No palpable cervical adenopathy. LUNGS:  Clear to auscultation. CARDIOVASCULAR:  Regular in rate without audible murmurs. ABDOMEN:  Soft, nontender abdomen. GU:  Uncircumcised with a buried penis.  He had a 200 CC transilluminating left hydrocele.  Testes were smooth, nontender, 25 CC in size each. NEUROMUSCULAR:  Alert and oriented x3.  IMPRESSION:  Left  hydrocele.  PLAN:  Left hydrocelectomy.          ______________________________ Maryan Puls     MW/MEDQ  D:  08/25/2015  T:  08/26/2015  Job:  [id number]

## 2015-08-31 ENCOUNTER — Ambulatory Visit
Admission: RE | Admit: 2015-08-31 | Discharge: 2015-08-31 | Disposition: A | Discharge status: Home / Self Care | Payer: PRIVATE HEALTH INSURANCE | Source: Ambulatory Visit | Attending: Urology | Admitting: Urology

## 2015-08-31 ENCOUNTER — Encounter
Admission: RE | Disposition: A | Discharge status: Home / Self Care | Payer: Self-pay | Source: Ambulatory Visit | Attending: Urology

## 2015-08-31 ENCOUNTER — Encounter: Payer: Self-pay | Admitting: *Deleted

## 2015-08-31 ENCOUNTER — Ambulatory Visit: Payer: PRIVATE HEALTH INSURANCE | Admitting: Anesthesiology

## 2015-08-31 DIAGNOSIS — G4730 Sleep apnea, unspecified: Secondary | ICD-10-CM | POA: Diagnosis not present

## 2015-08-31 DIAGNOSIS — Z7984 Long term (current) use of oral hypoglycemic drugs: Secondary | ICD-10-CM | POA: Diagnosis not present

## 2015-08-31 DIAGNOSIS — Z79899 Other long term (current) drug therapy: Secondary | ICD-10-CM | POA: Insufficient documentation

## 2015-08-31 DIAGNOSIS — Z881 Allergy status to other antibiotic agents status: Secondary | ICD-10-CM | POA: Diagnosis not present

## 2015-08-31 DIAGNOSIS — N433 Hydrocele, unspecified: Secondary | ICD-10-CM | POA: Diagnosis not present

## 2015-08-31 DIAGNOSIS — E119 Type 2 diabetes mellitus without complications: Secondary | ICD-10-CM | POA: Diagnosis not present

## 2015-08-31 DIAGNOSIS — R51 Headache: Secondary | ICD-10-CM | POA: Diagnosis not present

## 2015-08-31 DIAGNOSIS — I10 Essential (primary) hypertension: Secondary | ICD-10-CM | POA: Diagnosis not present

## 2015-08-31 DIAGNOSIS — Z885 Allergy status to narcotic agent status: Secondary | ICD-10-CM | POA: Diagnosis not present

## 2015-08-31 HISTORY — DX: Nausea with vomiting, unspecified: R11.2

## 2015-08-31 HISTORY — DX: Adverse effect of unspecified anesthetic, initial encounter: T41.45XA

## 2015-08-31 HISTORY — DX: Other specified postprocedural states: Z98.890

## 2015-08-31 HISTORY — DX: Proteinuria, unspecified: R80.9

## 2015-08-31 HISTORY — PX: HYDROCELE EXCISION: SHX482

## 2015-08-31 HISTORY — DX: Other complications of anesthesia, initial encounter: T88.59XA

## 2015-08-31 LAB — GLUCOSE, CAPILLARY
Glucose-Capillary: 113 mg/dL — ABNORMAL HIGH (ref 65–99)
Glucose-Capillary: 146 mg/dL — ABNORMAL HIGH (ref 65–99)

## 2015-08-31 SURGERY — HYDROCELECTOMY
Anesthesia: General | Laterality: Left

## 2015-08-31 MED ORDER — LIDOCAINE-EPINEPHRINE 1 %-1:100000 IJ SOLN
INTRAMUSCULAR | Status: DC | PRN
  Administered 2015-08-31: 10 mL

## 2015-08-31 MED ORDER — BUPIVACAINE HCL (PF) 0.25 % IJ SOLN
INTRAMUSCULAR | Status: AC
  Filled 2015-08-31: qty 30

## 2015-08-31 MED ORDER — CEFAZOLIN SODIUM 1-5 GM-% IV SOLN: 1.0000 g | INTRAVENOUS | Status: DC

## 2015-08-31 MED ORDER — CEFAZOLIN SODIUM 1-5 GM-% IV SOLN
INTRAVENOUS | Status: AC
  Filled 2015-08-31: qty 50

## 2015-08-31 MED ORDER — DEXAMETHASONE SODIUM PHOSPHATE 4 MG/ML IJ SOLN
INTRAMUSCULAR | Status: DC | PRN
  Administered 2015-08-31 (×2): 5 mg via INTRAVENOUS

## 2015-08-31 MED ORDER — LIDOCAINE HCL (PF) 1 % IJ SOLN
INTRAMUSCULAR | Status: AC
  Filled 2015-08-31: qty 30

## 2015-08-31 MED ORDER — FENTANYL CITRATE (PF) 100 MCG/2ML IJ SOLN
INTRAMUSCULAR | Status: DC | PRN
  Administered 2015-08-31 (×2): 50 ug via INTRAVENOUS

## 2015-08-31 MED ORDER — SCOPOLAMINE 1 MG/3DAYS TD PT72
1.0000 | MEDICATED_PATCH | Freq: Once | TRANSDERMAL | Status: DC
  Administered 2015-08-31: 1.5 mg via TRANSDERMAL

## 2015-08-31 MED ORDER — EPHEDRINE SULFATE 50 MG/ML IJ SOLN
INTRAMUSCULAR | Status: DC | PRN
  Administered 2015-08-31 (×2): 10 mg via INTRAVENOUS

## 2015-08-31 MED ORDER — SCOPOLAMINE 1 MG/3DAYS TD PT72
MEDICATED_PATCH | TRANSDERMAL | Status: AC
  Filled 2015-08-31: qty 1

## 2015-08-31 MED ORDER — MIDAZOLAM HCL 2 MG/2ML IJ SOLN
INTRAMUSCULAR | Status: DC | PRN
  Administered 2015-08-31: 2 mg via INTRAVENOUS

## 2015-08-31 MED ORDER — PROPOFOL 10 MG/ML IV BOLUS
INTRAVENOUS | Status: DC | PRN
  Administered 2015-08-31: 230 mg via INTRAVENOUS

## 2015-08-31 MED ORDER — FENTANYL CITRATE (PF) 100 MCG/2ML IJ SOLN
25.0000 ug | INTRAMUSCULAR | Status: DC | PRN
  Administered 2015-08-31 (×4): 25 ug via INTRAVENOUS

## 2015-08-31 MED ORDER — ONDANSETRON HCL 4 MG/2ML IJ SOLN
4.0000 mg | Freq: Once | INTRAMUSCULAR | Status: AC | PRN
  Administered 2015-08-31: 4 mg via INTRAVENOUS

## 2015-08-31 MED ORDER — PHENYLEPHRINE HCL 10 MG/ML IJ SOLN
INTRAMUSCULAR | Status: DC | PRN
  Administered 2015-08-31 (×3): 100 ug via INTRAVENOUS

## 2015-08-31 MED ORDER — HYDROCODONE-ACETAMINOPHEN 5-325 MG PO TABS
2.0000 | ORAL_TABLET | Freq: Once | ORAL | Status: AC
Start: 2015-08-31 — End: 2015-08-31
  Administered 2015-08-31: 2 via ORAL

## 2015-08-31 MED ORDER — ONDANSETRON HCL 4 MG/2ML IJ SOLN
INTRAMUSCULAR | Status: DC | PRN
  Administered 2015-08-31: 4 mg via INTRAVENOUS

## 2015-08-31 MED ORDER — BUPIVACAINE HCL (PF) 0.25 % IJ SOLN
INTRAMUSCULAR | Status: DC | PRN
  Administered 2015-08-31: 10 mL

## 2015-08-31 MED ORDER — NUCYNTA 50 MG PO TABS: 50.0000 mg | ORAL_TABLET | Freq: Four times a day (QID) | ORAL | Status: DC | PRN

## 2015-08-31 MED ORDER — CEPHALEXIN 500 MG PO CAPS: 500.0000 mg | ORAL_CAPSULE | Freq: Four times a day (QID) | ORAL | Status: DC

## 2015-08-31 MED ORDER — LIDOCAINE HCL (CARDIAC) 20 MG/ML IV SOLN
INTRAVENOUS | Status: DC | PRN
  Administered 2015-08-31: 100 mg via INTRAVENOUS

## 2015-08-31 MED ORDER — FENTANYL CITRATE (PF) 100 MCG/2ML IJ SOLN
INTRAMUSCULAR | Status: AC
  Administered 2015-08-31: 25 ug via INTRAVENOUS
  Filled 2015-08-31: qty 2

## 2015-08-31 MED ORDER — BUPIVACAINE HCL (PF) 0.5 % IJ SOLN
INTRAMUSCULAR | Status: AC
  Filled 2015-08-31: qty 30

## 2015-08-31 MED ORDER — SCOPOLAMINE 1 MG/3DAYS TD PT72: 1.0000 | MEDICATED_PATCH | Freq: Once | TRANSDERMAL | Status: DC

## 2015-08-31 MED ORDER — ONDANSETRON HCL 4 MG/2ML IJ SOLN
INTRAMUSCULAR | Status: AC
  Administered 2015-08-31: 4 mg via INTRAVENOUS
  Filled 2015-08-31: qty 2

## 2015-08-31 MED ORDER — SODIUM CHLORIDE 0.9 % IV SOLN
INTRAVENOUS | Status: DC
  Administered 2015-08-31: 13:00:00 via INTRAVENOUS

## 2015-08-31 MED ORDER — DOCUSATE SODIUM 100 MG PO CAPS: 200.0000 mg | ORAL_CAPSULE | Freq: Two times a day (BID) | ORAL | Status: DC

## 2015-08-31 MED ORDER — HYDROCODONE-ACETAMINOPHEN 5-325 MG PO TABS
ORAL_TABLET | ORAL | Status: AC
  Administered 2015-08-31: 2 via ORAL
  Filled 2015-08-31: qty 2

## 2015-08-31 SURGICAL SUPPLY — 32 items
BLADE CLIPPER SURG (BLADE) ×3 IMPLANT
BLADE SURG 15 STRL LF DISP TIS (BLADE) ×1 IMPLANT
BLADE SURG 15 STRL SS (BLADE) ×3
DRAPE PED LAPAROTOMY (DRAPES) ×3 IMPLANT
DRESSING TELFA 4X3 1S ST N-ADH (GAUZE/BANDAGES/DRESSINGS) ×3 IMPLANT
ELECT CAUTERY NEEDLE TIP 1.0 (MISCELLANEOUS) ×3
ELECTRODE CAUTERY NEDL TIP 1.0 (MISCELLANEOUS) ×1 IMPLANT
GAUZE FLUFF 18X24 1PLY STRL (GAUZE/BANDAGES/DRESSINGS) ×3 IMPLANT
GLOVE BIO SURGEON STRL SZ7.5 (GLOVE) ×3 IMPLANT
GOWN STRL REUS W/ TWL LRG LVL3 (GOWN DISPOSABLE) ×2 IMPLANT
GOWN STRL REUS W/TWL LRG LVL3 (GOWN DISPOSABLE) ×6
GOWN STRL REUS W/TWL XL LVL3 (GOWN DISPOSABLE) ×3 IMPLANT
KIT RM TURNOVER STRD PROC AR (KITS) ×3 IMPLANT
LABEL OR SOLS (LABEL) ×3 IMPLANT
NDL HYPO 25X1 1.5 SAFETY (NEEDLE) IMPLANT
NEEDLE HYPO 25X1 1.5 SAFETY (NEEDLE) ×3 IMPLANT
NS IRRIG 500ML POUR BTL (IV SOLUTION) ×3 IMPLANT
PACK BASIN MINOR ARMC (MISCELLANEOUS) ×3 IMPLANT
PAD GROUND ADULT SPLIT (MISCELLANEOUS) ×3 IMPLANT
PREP PVP WINGED SPONGE (MISCELLANEOUS) ×3 IMPLANT
SOL PREP PVP 2OZ (MISCELLANEOUS) ×3
SOLUTION PREP PVP 2OZ (MISCELLANEOUS) ×1 IMPLANT
SUPPORETR ATHLETIC LG (MISCELLANEOUS) ×1 IMPLANT
SUPPORTER ATHLETIC LG (MISCELLANEOUS) ×3
SUT CHROMIC 3 0 SH 27 (SUTURE) ×9 IMPLANT
SUT PLAIN 3 0 SH 27IN (SUTURE) ×1 IMPLANT
SUT PLAIN 4 0 FS 2 27 (SUTURE) ×1 IMPLANT
SUT VIC AB 4-0 PS2 18 (SUTURE) ×1 IMPLANT
SUT VIC AB 4-0 SH 27 (SUTURE) ×3
SUT VIC AB 4-0 SH 27XANBCTRL (SUTURE) ×2 IMPLANT
SYR 50ML LL SCALE MARK (SYRINGE) ×3 IMPLANT
SYRINGE 10CC LL (SYRINGE) ×2 IMPLANT

## 2015-08-31 NOTE — Discharge Instructions (Signed)
Hydrocele, Adult A hydrocele is a collection of fluid in the loose pouch of skin that holds the testicles (scrotum). Usually, it affects only one testicle. CAUSES This condition may be caused by:  An injury to the scrotum.  An infection.  A tumor or cancer of the testicle.  Twisting of a testicle.  Decreased blood flow to the scrotum. SYMPTOMS A hydrocele feels like a water-filled balloon. It may also feel heavy. A hydrocele can cause:  Swelling of the scrotum. The swelling may decrease when you lie down.  Swelling of the groin.  Mild discomfort in the scrotum.  Pain. This can develop if the hydrocele was caused by infection or twisting. DIAGNOSIS This condition may be diagnosed with a medical history, physical exam, and imaging tests. You may also have blood and urine tests to check for infection. TREATMENT Treatment may include:  Watching and waiting, particularly if the hydrocele causes no symptoms.  Treatment of the underlying condition. This may include using antibiotic medicine.  Surgery to drain the fluid. Some surgical options include:  Needle aspiration. For this procedure, a needle is used to drain fluid.  Hydrocelectomy. For this procedure, an incision is made in the scrotum to remove the fluid sac. HOME CARE INSTRUCTIONS  Keep all follow-up visits as told by your health care provider. This is important.  Watch the hydrocele for any changes.  Take over-the-counter and prescription medicines only as told by your health care provider.  If you were prescribed an antibiotic medicine, use it as told by your health care provider. Do not stop using the antibiotic even if your condition improves. SEEK MEDICAL CARE IF:  The swelling in your scrotum or groin gets worse.  The hydrocele becomes red, firm, tender to the touch, or painful.  You notice any changes in the hydrocele.  You have a fever.   This information is not intended to replace advice given to  you by your health care provider. Make sure you discuss any questions you have with your health care provider.   Document Released: 03/29/2010 Document Revised: 02/23/2015 Document Reviewed: 10/05/2014 Elsevier Interactive Patient Education 2016 [place] After Refer to this sheet in the next few weeks. These instructions provide you with information about caring for yourself after your procedure. Your health care provider may also give you more specific instructions. Your treatment has been planned according to current medical practices, but problems sometimes occur. Call your health care provider if you have any problems or questions after your procedure. WHAT TO EXPECT AFTER THE PROCEDURE After your procedure, it is common for the pouch that holds your testicles (scrotum) to be painful, swollen, and bruised. HOME CARE INSTRUCTIONS Bathing  Ask your health care provider when you can shower, take baths, or go swimming.  If you were told to wear an athletic support strap, take it off when you shower or take a bath. Incision Care  Follow instructions from your health care provider about how to take care of your incision. Make sure you:  Wash your hands with soap and water before you change your bandage (dressing). If soap and water are not available, use hand sanitizer.  Change your dressing as told by your health care provider.  Leave stitches (sutures) in place.  Check your incision and scrotum every day for signs of infection. Check for:  More redness, swelling, or pain.  Blood or fluid.  Warmth.  Pus or a bad smell. Managing Pain, Stiffness, and Swelling  If  directed, apply ice to the injured area:  Put ice in a plastic bag.  Place a towel between your skin and the bag.  Leave the ice on for 20 minutes, 2-3 times per day. Driving  Do not drive for 24 hours if you received a sedative.  Do not drive or operate heavy machinery while taking  prescription pain medicine.  Ask your health care provider when it is safe to drive. Activity  Do not do any activities that require great strength and energy (are vigorous) for as long as told by your health care provider.  Return to your normal activities as told by your health care provider. Ask your health care provider what activities are safe for you.  Do not lift anything that is heavier than 10 lb (4.5 kg) until your health care provider says that it is safe. General Instructions  Take over-the-counter and prescription medicines only as told by your health care provider.  Keep all follow-up visits as told by your health care provider. This is important.  If you were given an athletic support strap, wear it as told by your health care provider.  If you had a drain put in during the procedure, you will need to return to have it removed. SEEK MEDICAL CARE IF:  Your pain gets worse.  You have more redness, swelling, or pain around your scrotum.  You have blood or fluid coming from your scrotum.  Your incision feels warm to the touch.  You have pus or a bad smell coming from your scrotum.  You have a fever.   This information is not intended to replace advice given to you by your health care provider. Make sure you discuss any questions you have with your health care provider.   Document Released: 06/30/2015 Document Reviewed: 04/07/2015 Elsevier Interactive Patient Education 2016 [place] Anesthesia, Adult, Care After Refer to this sheet in the next few weeks. These instructions provide you with information on caring for yourself after your procedure. Your health care provider may also give you more specific instructions. Your treatment has been planned according to current medical practices, but problems sometimes occur. Call your health care provider if you have any problems or questions after your procedure. WHAT TO EXPECT AFTER THE PROCEDURE After the  procedure, it is typical to experience:  Sleepiness.  Nausea and vomiting. HOME CARE INSTRUCTIONS  For the first 24 hours after general anesthesia:  Have a responsible person with you.  Do not drive a car. If you are alone, do not take public transportation.  Do not drink alcohol.  Do not take medicine that has not been prescribed by your health care provider.  Do not sign important papers or make important decisions.  You may resume a normal diet and activities as directed by your health care provider.  Change bandages (dressings) as directed.  If you have questions or problems that seem related to general anesthesia, call the hospital and ask for the anesthetist or anesthesiologist on call. SEEK MEDICAL CARE IF:  You have nausea and vomiting that continue the day after anesthesia.  You develop a rash. SEEK IMMEDIATE MEDICAL CARE IF:   You have difficulty breathing.  You have chest pain.  You have any allergic problems.   This information is not intended to replace advice given to you by your health care provider. Make sure you discuss any questions you have with your health care provider.   Document Released: 01/15/2001 Document Revised: 10/30/2014 Document Reviewed:  02/07/2012 Elsevier Interactive Patient Education [employer].

## 2015-08-31 NOTE — Op Note (Signed)
Preoperative diagnosis: Left hydrocele Postoperative diagnosis: Same  Procedure: 1. Left hydrocelectomy                      2. Left spermatic cord block Surgeon: Otelia Limes. Yves Dill MD, FACS Anesthesia: Gen.  Indications:See the history and physical. After informed consent the above procedure(s) were requested     Technique and findings: After adequate general anesthesia had been obtained the scrotum and lower abdomen was prepped and draped in the usual fashion. Patient had a large left hydrocele present. Testes were smooth without palpable nodules and approximate 18 cc size each at this point a midline scrotal raphae incision was made and carried down sharply through skin and through the dartos fascia with electrocautery. The hydrocele is then opened and 200 cc of clear yellow fluid drained. The hydrocele sac was fully opened and then marsupialized posteriorly with running and locking 3-0 chromic suture. The testicle was then pexed into the scrotal compartment with 3 strategically placed sutures of 3-0 chromic. The dartos fascia was then closed with running and locking 3-0 chromic suture. Skin was reapproximated with interrupted 3-0 chromic. 1% Xylocaine with epinephrine was applied subcutaneously at the scrotal incision site. Left spermatic cord block was performed with quarter percent Marcaine. Sterile dressing and scrotal supporter were applied. Sponge needle and instrument counts were noted be correct. The procedure was then terminated and the patient was transferred to the recovery room in stable condition.

## 2015-08-31 NOTE — H&P (Signed)
Date of Initial H&P: 08/25/15  History reviewed, patient examined, no change in status, stable for surgery.

## 2015-08-31 NOTE — Transfer of Care (Signed)
Immediate Anesthesia Transfer of Care Note  Patient: Stuart Rasmussen  Procedure(s) Performed: Procedure(s): HYDROCELECTOMY ADULT (Left)  Patient Location: PACU  Anesthesia Type:General  Level of Consciousness: awake, alert  and oriented  Airway & Oxygen Therapy: Patient Spontanous Breathing and Patient connected to face mask oxygen  Post-op Assessment: Report given to RN and Post -op Vital signs reviewed and stable  Post vital signs: stable  Last Vitals:  Filed Vitals:   08/31/15 1438  BP: 147/84  Pulse: 81  Temp: 37.9 C  Resp:     Complications: No apparent anesthesia complications

## 2015-08-31 NOTE — Anesthesia Procedure Notes (Signed)
Procedure Name: LMA Insertion Date/Time: 08/31/2015 1:23 PM Performed by: Doreen Salvage Pre-anesthesia Checklist: Patient identified, Patient being monitored, Timeout performed, Emergency Drugs available and Suction available Patient Re-evaluated:Patient Re-evaluated prior to inductionOxygen Delivery Method: Circle system utilized Preoxygenation: Pre-oxygenation with 100% oxygen Intubation Type: IV induction Ventilation: Mask ventilation without difficulty LMA: LMA inserted LMA Size: 5.0 Tube type: Oral Number of attempts: 1 Placement Confirmation: positive ETCO2 and breath sounds checked- equal and bilateral Tube secured with: Tape Dental Injury: Teeth and Oropharynx as per pre-operative assessment

## 2015-08-31 NOTE — Anesthesia Preprocedure Evaluation (Signed)
Anesthesia Evaluation  Patient identified by MRN, date of birth, ID band Patient awake    Reviewed: Allergy & Precautions, H&P , NPO status , Patient's Chart, lab work & pertinent test results, reviewed documented beta blocker date and time   History of Anesthesia Complications (+) PONV and history of anesthetic complications  Airway Mallampati: III  TM Distance: >3 FB Neck ROM: full    Dental no notable dental hx. (+) Teeth Intact   Pulmonary neg pulmonary ROS, sleep apnea and Continuous Positive Airway Pressure Ventilation , Current Smoker,    Pulmonary exam normal breath sounds clear to auscultation       Cardiovascular Exercise Tolerance: Good hypertension, negative cardio ROS Normal cardiovascular exam Rhythm:regular Rate:Normal     Neuro/Psych  Headaches, negative neurological ROS  negative psych ROS   GI/Hepatic negative GI ROS, Neg liver ROS,   Endo/Other  negative endocrine ROSdiabetes  Renal/GU negative Renal ROS  negative genitourinary   Musculoskeletal   Abdominal   Peds  Hematology negative hematology ROS (+)   Anesthesia Other Findings   Reproductive/Obstetrics negative OB ROS                             Anesthesia Physical Anesthesia Plan  ASA: III  Anesthesia Plan: General and General LMA   Post-op Pain Management:    Induction:   Airway Management Planned:   Additional Equipment:   Intra-op Plan:   Post-operative Plan:   Informed Consent: I have reviewed the patients History and Physical, chart, labs and discussed the procedure including the risks, benefits and alternatives for the proposed anesthesia with the patient or authorized representative who has indicated his/her understanding and acceptance.   Dental Advisory Given  Plan Discussed with: CRNA  Anesthesia Plan Comments:         Anesthesia Quick Evaluation

## 2015-09-06 NOTE — Anesthesia Postprocedure Evaluation (Signed)
  Anesthesia Post-op Note  Patient: Stuart Rasmussen  Procedure(s) Performed: Procedure(s): HYDROCELECTOMY ADULT (Left)  Anesthesia type:General, General LMA  Patient location: PACU  Post pain: Pain level controlled  Post assessment: Post-op Vital signs reviewed, Patient's Cardiovascular Status Stable, Respiratory Function Stable, Patent Airway and No signs of Nausea or vomiting  Post vital signs: Reviewed and stable  Last Vitals:  Filed Vitals:   08/31/15 1615  BP: 156/86  Pulse: 82  Temp:   Resp: 18    Level of consciousness: awake, alert  and patient cooperative  Complications: No apparent anesthesia complications

## 2015-11-29 DIAGNOSIS — Z85828 Personal history of other malignant neoplasm of skin: Secondary | ICD-10-CM | POA: Insufficient documentation

## 2016-01-13 DIAGNOSIS — Z85038 Personal history of other malignant neoplasm of large intestine: Secondary | ICD-10-CM | POA: Insufficient documentation

## 2017-04-09 DIAGNOSIS — Z79899 Other long term (current) drug therapy: Secondary | ICD-10-CM | POA: Insufficient documentation

## 2017-05-09 ENCOUNTER — Other Ambulatory Visit: Payer: Self-pay | Admitting: Urology

## 2017-05-09 DIAGNOSIS — N23 Unspecified renal colic: Secondary | ICD-10-CM

## 2017-05-09 DIAGNOSIS — R310 Gross hematuria: Secondary | ICD-10-CM

## 2017-05-18 ENCOUNTER — Ambulatory Visit
Admission: RE | Admit: 2017-05-18 | Discharge: 2017-05-18 | Disposition: A | Discharge status: Home / Self Care | Payer: Managed Care, Other (non HMO) | Source: Ambulatory Visit | Attending: Urology | Admitting: Urology

## 2017-05-18 DIAGNOSIS — Z7984 Long term (current) use of oral hypoglycemic drugs: Secondary | ICD-10-CM | POA: Diagnosis not present

## 2017-05-18 DIAGNOSIS — R310 Gross hematuria: Secondary | ICD-10-CM | POA: Insufficient documentation

## 2017-05-18 DIAGNOSIS — K760 Fatty (change of) liver, not elsewhere classified: Secondary | ICD-10-CM | POA: Insufficient documentation

## 2017-05-18 DIAGNOSIS — N23 Unspecified renal colic: Secondary | ICD-10-CM

## 2017-05-18 DIAGNOSIS — N132 Hydronephrosis with renal and ureteral calculous obstruction: Secondary | ICD-10-CM | POA: Insufficient documentation

## 2017-05-18 DIAGNOSIS — R109 Unspecified abdominal pain: Secondary | ICD-10-CM | POA: Diagnosis present

## 2017-05-18 LAB — POCT I-STAT CREATININE: Creatinine, Ser: 1 mg/dL (ref 0.61–1.24)

## 2017-05-18 IMAGING — CT CT ABD-PEL WO/W CM
2 of 6 series · 13 of 32 positions shown, 18 images · IV contrast (isovue)
Comparison: CT CAP [DATE]

CLINICAL DATA: Patient with severe right flank pain and gross
hematuria, intermittent for 1-2 months. History of colon cancer
status post partial colonic resection in [ON].

EXAM:
CT ABDOMEN AND PELVIS WITHOUT AND WITH CONTRAST
TECHNIQUE: Multidetector CT imaging of the abdomen and pelvis was performed
following the standard protocol before and following the bolus
administration of intravenous contrast.
CONTRAST:  125 cc Isovue 370

[Series 2: axial pre · axial · non-contrast · 0.93mm/px · z∈[-994,-624]mm · 6 of 104 slices shown]
[im 15/104  soft-tissue]
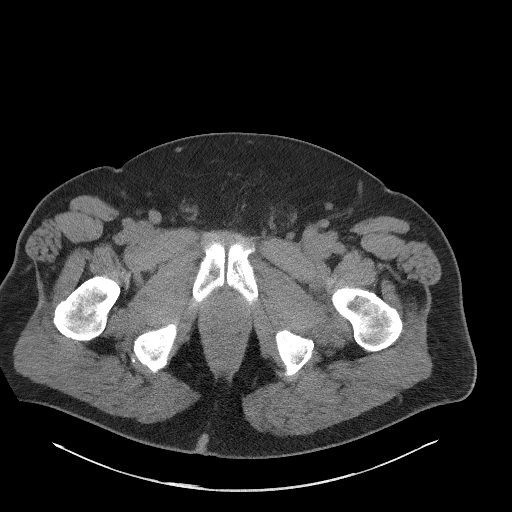
[im 30/104  soft-tissue]
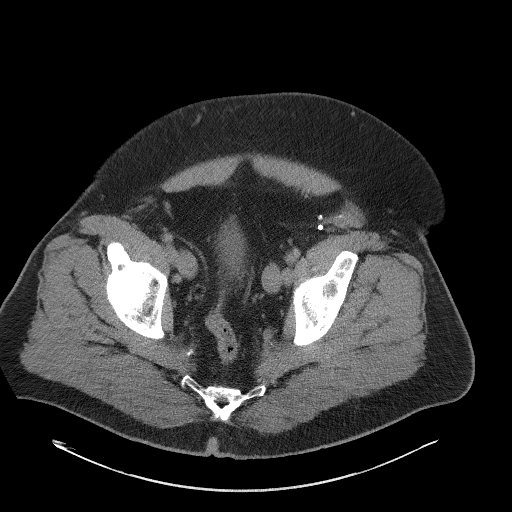
[im 45/104  soft-tissue]
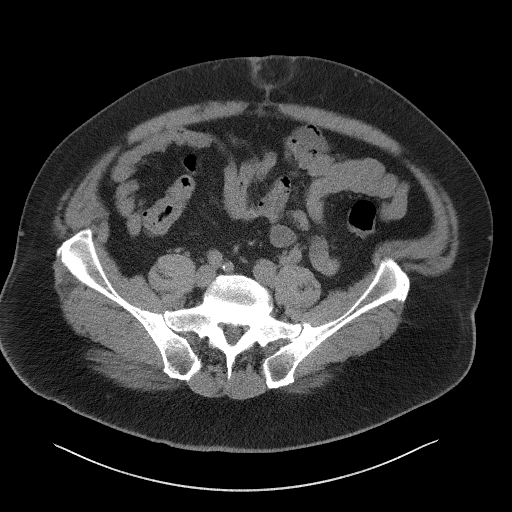
[im 59/104  soft-tissue]
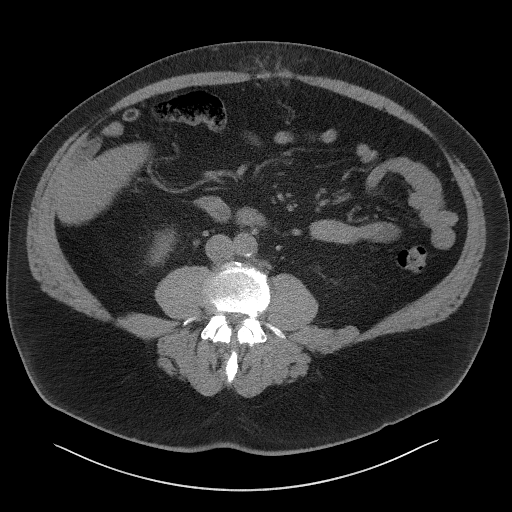
[im 74/104  soft-tissue]
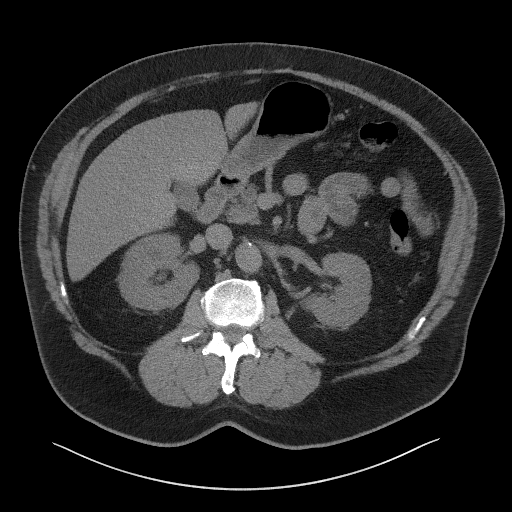
[im 89/104  soft-tissue]
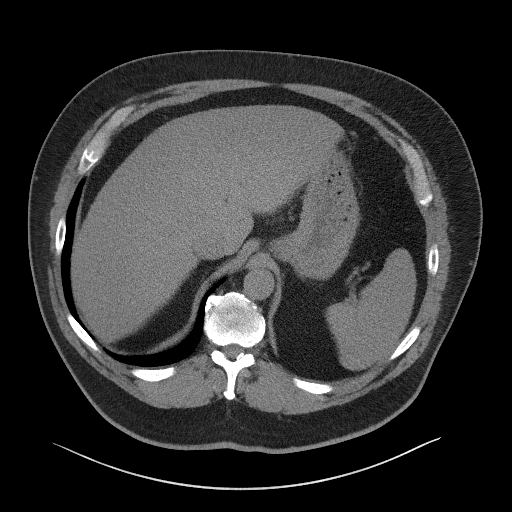

[Series 13: axial delay · axial · delayed · 0.93mm/px · z∈[-1073,-643]mm · 7 of 116 slices shown, 12 images]
[im 15/116  soft-tissue]
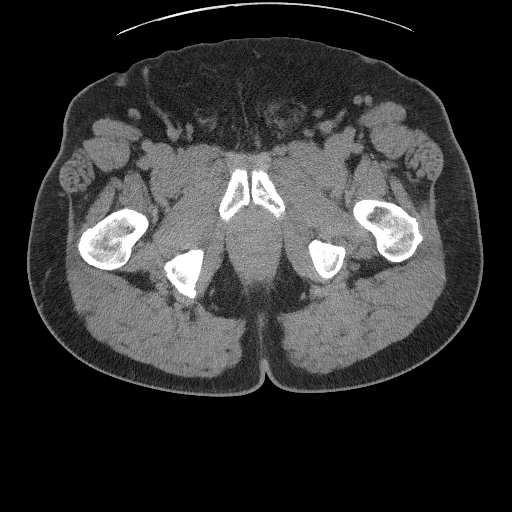
[im 15/116  bone]
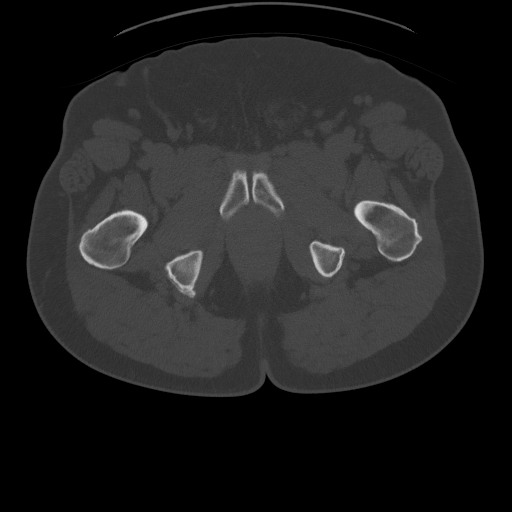
[im 29/116  soft-tissue]
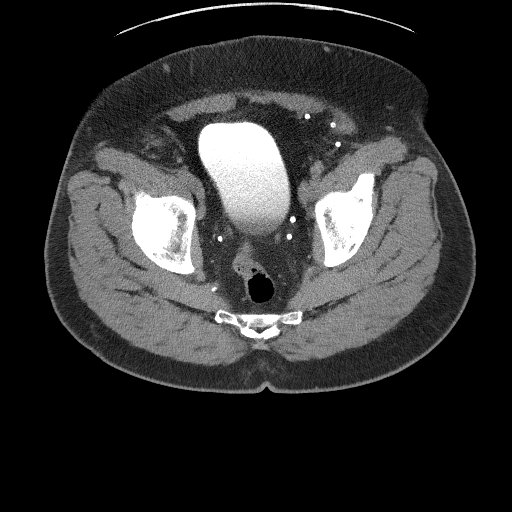
[im 44/116  soft-tissue]
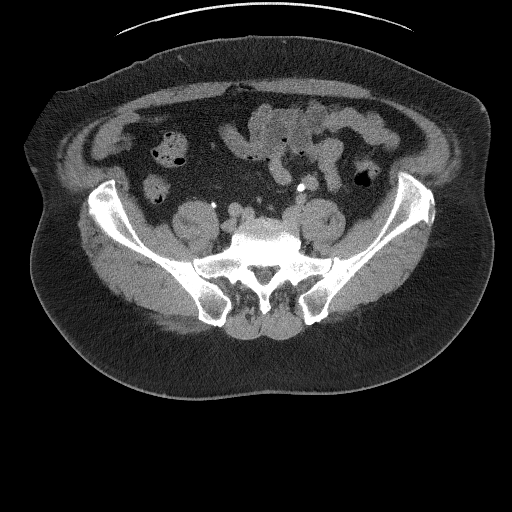
[im 58/116  soft-tissue]
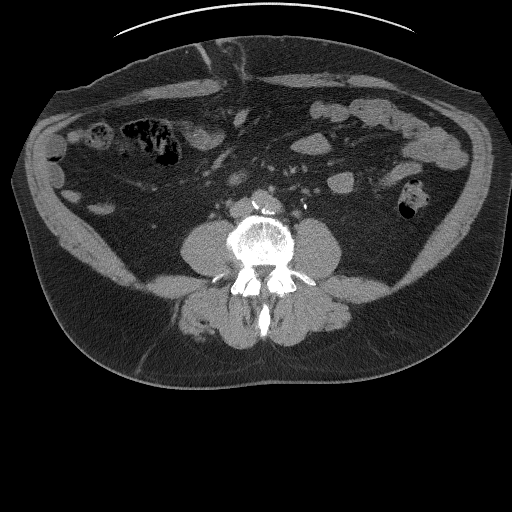
[im 58/116  lung]
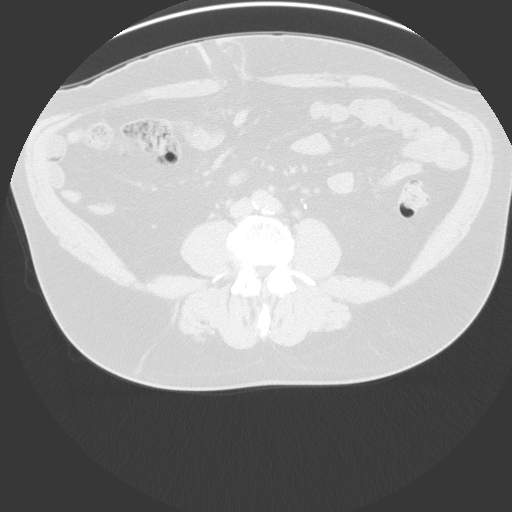
[im 72/116  soft-tissue]
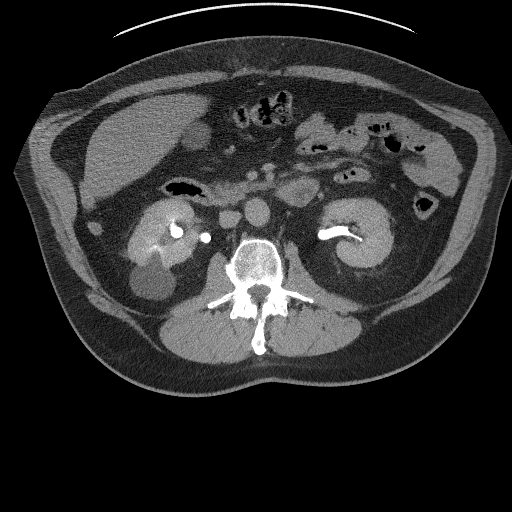
[im 72/116  lung]
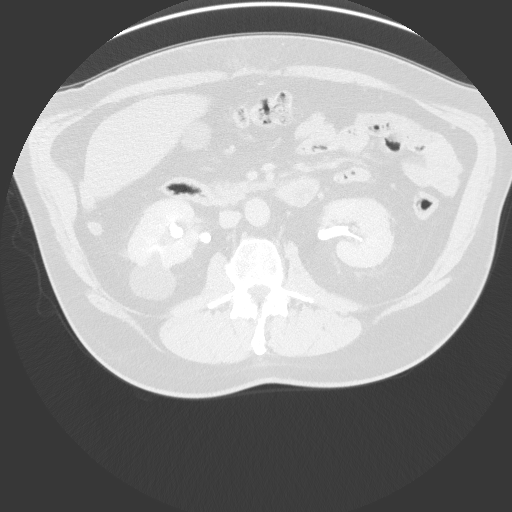
[im 87/116  soft-tissue]
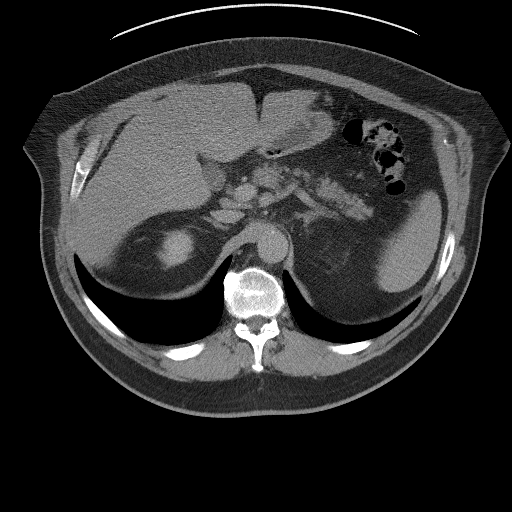
[im 87/116  lung]
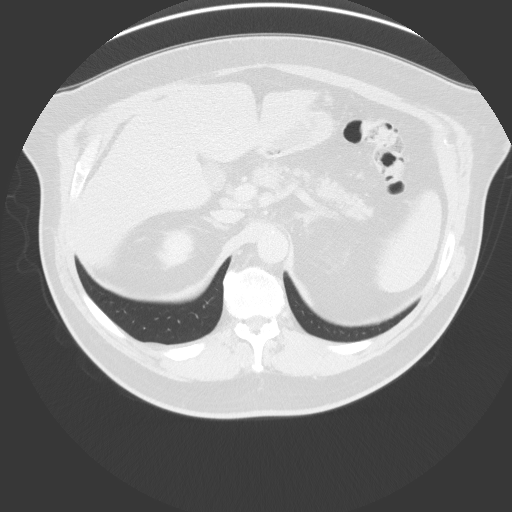
[im 101/116  soft-tissue]
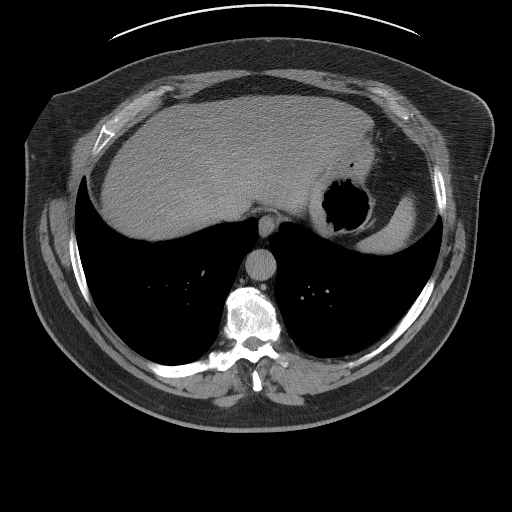
[im 101/116  lung]
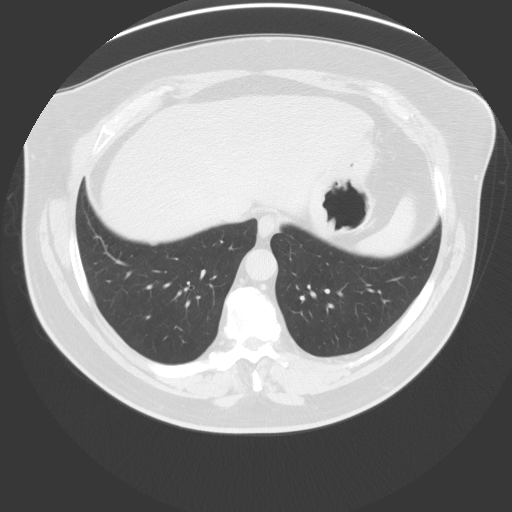

[13 of 32 positions shown; findings below may reference images not displayed]

FINDINGS: Lower chest: Normal heart size. Lung bases are clear. No pleural
effusion.

Hepatobiliary: Liver is diffusely low in attenuation compatible with
hepatic steatosis. Fatty deposition adjacent to the falciform
ligament. No focal hepatic lesion is identified. Gallbladder is
unremarkable. No intrahepatic or extrahepatic biliary ductal
dilatation.

Pancreas: Unremarkable

Spleen: Unremarkable

Adrenals/Urinary Tract: The adrenal glands are normal. There is a
2.3 cm cyst within the superior pole of the right kidney and an
exophytic 4.2 cm cyst inferior pole right kidney. There is mild
right hydronephrosis and ureterectasis to the proximal right ureter
were there is a 6 mm stone (image 49; series 4). There is a punctate
2 mm stone within the superior pole of the left kidney (image 85;
series 7). Delayed images were obtained and demonstrate excretion of
contrast material into the bilateral renal collecting systems and
ureters. The opacified portions are unremarkable. The mid aspect of
the right ureter is not opacified and therefore not assessed.

Stomach/Bowel: Descending and sigmoid colonic diverticulosis. No CT
evidence for acute diverticulitis. Status post ascending colectomy.
Normal morphology of the stomach. No free fluid or free
intraperitoneal air.

Vascular/Lymphatic: Normal caliber abdominal aorta. Peripheral
calcified atherosclerotic plaque. No retroperitoneal
lymphadenopathy. Prominent retroperitoneal and pelvic lymph nodes
are stable dating back to [ON] including an 11 mm left external
iliac lymph node (Image 74; series 4).

Reproductive: Prostate is unremarkable.

Other: Left-greater-than-right bilateral fat containing inguinal
hernias. Postsurgical changes left lower anterior abdominal wall.
Fat containing periumbilical hernia (image 56; series 4). Anterior
abdominal wall fat containing hernia (image 46; series 4).

Musculoskeletal: Lower thoracic and lumbar spine degenerative
changes. No aggressive or acute appearing osseous lesions.
IMPRESSION: There is a 6 mm stone within the proximal right ureter resulting in
mild right hydroureteronephrosis to the level of stone.

Hepatic steatosis.

Periumbilical fat containing hernia and additional anterior
abdominal wall fat containing hernia.

These results will be called to the ordering clinician or
representative by the Radiologist Assistant, and communication
documented in the PACS or zVision Dashboard.

## 2017-05-18 MED ORDER — IOPAMIDOL (ISOVUE-370) INJECTION 76%
125.0000 mL | Freq: Once | INTRAVENOUS | Status: AC | PRN
  Administered 2017-05-18: 125 mL via INTRAVENOUS

## 2017-05-24 ENCOUNTER — Encounter: Payer: Self-pay | Admitting: *Deleted

## 2017-05-24 ENCOUNTER — Encounter
Admission: RE | Disposition: A | Discharge status: Home / Self Care | Payer: Self-pay | Source: Ambulatory Visit | Attending: Urology

## 2017-05-24 ENCOUNTER — Ambulatory Visit
Admission: RE | Admit: 2017-05-24 | Discharge: 2017-05-24 | Disposition: A | Discharge status: Home / Self Care | Payer: Managed Care, Other (non HMO) | Source: Ambulatory Visit | Attending: Urology | Admitting: Urology

## 2017-05-24 DIAGNOSIS — N201 Calculus of ureter: Secondary | ICD-10-CM

## 2017-05-24 DIAGNOSIS — Z7984 Long term (current) use of oral hypoglycemic drugs: Secondary | ICD-10-CM | POA: Insufficient documentation

## 2017-05-24 DIAGNOSIS — G4730 Sleep apnea, unspecified: Secondary | ICD-10-CM | POA: Diagnosis not present

## 2017-05-24 DIAGNOSIS — E119 Type 2 diabetes mellitus without complications: Secondary | ICD-10-CM | POA: Insufficient documentation

## 2017-05-24 DIAGNOSIS — Z85038 Personal history of other malignant neoplasm of large intestine: Secondary | ICD-10-CM | POA: Diagnosis not present

## 2017-05-24 DIAGNOSIS — F17210 Nicotine dependence, cigarettes, uncomplicated: Secondary | ICD-10-CM | POA: Insufficient documentation

## 2017-05-24 DIAGNOSIS — I10 Essential (primary) hypertension: Secondary | ICD-10-CM | POA: Insufficient documentation

## 2017-05-24 DIAGNOSIS — N132 Hydronephrosis with renal and ureteral calculous obstruction: Secondary | ICD-10-CM | POA: Insufficient documentation

## 2017-05-24 HISTORY — PX: EXTRACORPOREAL SHOCK WAVE LITHOTRIPSY: SHX1557

## 2017-05-24 LAB — GLUCOSE, CAPILLARY: GLUCOSE-CAPILLARY: 140 mg/dL — AB (ref 65–99)

## 2017-05-24 SURGERY — LITHOTRIPSY, ESWL
Anesthesia: Moderate Sedation | Laterality: Right

## 2017-05-24 MED ORDER — DEXTROSE-NACL 5-0.45 % IV SOLN
INTRAVENOUS | Status: DC
  Administered 2017-05-24: 13:00:00 via INTRAVENOUS

## 2017-05-24 MED ORDER — FUROSEMIDE 10 MG/ML IJ SOLN
INTRAMUSCULAR | Status: AC
  Administered 2017-05-24: 10 mg via INTRAVENOUS
  Filled 2017-05-24: qty 2

## 2017-05-24 MED ORDER — ONDANSETRON 8 MG PO TBDP: 4.0000 mg | ORAL_TABLET | Freq: Four times a day (QID) | ORAL | 3 refills | Status: DC | PRN

## 2017-05-24 MED ORDER — FUROSEMIDE 10 MG/ML IJ SOLN
10.0000 mg | Freq: Once | INTRAMUSCULAR | Status: AC
  Administered 2017-05-24: 10 mg via INTRAVENOUS

## 2017-05-24 MED ORDER — MIDAZOLAM HCL 2 MG/2ML IJ SOLN
INTRAMUSCULAR | Status: AC
  Filled 2017-05-24: qty 2

## 2017-05-24 MED ORDER — LEVOFLOXACIN 500 MG PO TABS
ORAL_TABLET | ORAL | Status: AC
  Filled 2017-05-24: qty 1

## 2017-05-24 MED ORDER — LEVOFLOXACIN 500 MG PO TABS
500.0000 mg | ORAL_TABLET | Freq: Once | ORAL | Status: AC
  Administered 2017-05-24: 500 mg via ORAL

## 2017-05-24 MED ORDER — DIPHENHYDRAMINE HCL 25 MG PO CAPS
ORAL_CAPSULE | ORAL | Status: AC
  Filled 2017-05-24: qty 1

## 2017-05-24 MED ORDER — PROMETHAZINE HCL 25 MG/ML IJ SOLN
25.0000 mg | Freq: Once | INTRAMUSCULAR | Status: AC
  Administered 2017-05-24: 25 mg via INTRAMUSCULAR

## 2017-05-24 MED ORDER — CIPROFLOXACIN HCL 500 MG PO TABS: 500.0000 mg | ORAL_TABLET | Freq: Two times a day (BID) | ORAL | 0 refills | Status: DC

## 2017-05-24 MED ORDER — PROMETHAZINE HCL 25 MG/ML IJ SOLN
INTRAMUSCULAR | Status: AC
  Filled 2017-05-24: qty 1

## 2017-05-24 MED ORDER — NUCYNTA 50 MG PO TABS: 50.0000 mg | ORAL_TABLET | Freq: Four times a day (QID) | ORAL | 0 refills | Status: DC | PRN

## 2017-05-24 MED ORDER — DIPHENHYDRAMINE HCL 25 MG PO CAPS
25.0000 mg | ORAL_CAPSULE | ORAL | Status: AC
Start: 2017-05-24 — End: 2017-05-24
  Administered 2017-05-24: 25 mg via ORAL

## 2017-05-24 MED ORDER — MIDAZOLAM HCL 2 MG/2ML IJ SOLN
1.0000 mg | Freq: Once | INTRAMUSCULAR | Status: AC
  Administered 2017-05-24: 1 mg via INTRAMUSCULAR

## 2017-05-24 NOTE — Discharge Instructions (Addendum)
Kidney Stones Kidney stones (urolithiasis) are solid, rock-like deposits that form inside of the organs that make urine (kidneys). A kidney stone may form in a kidney and move into the bladder, where it can cause intense pain and block the flow of urine. Kidney stones are created when high levels of certain minerals are found in the urine. They are usually passed through urination, but in some cases, medical treatment may be needed to remove them. What are the causes? Kidney stones may be caused by:  A condition in which certain glands produce too much parathyroid hormone (primary hyperparathyroidism), which causes too much calcium buildup in the blood.  Buildup of uric acid crystals in the bladder (hyperuricosuria). Uric acid is a chemical that the body produces when you eat certain foods. It usually exits the body in the urine.  Narrowing (stricture) of one or both of the tubes that drain urine from the kidneys to the bladder (ureters).  A kidney blockage that is present at birth (congenital obstruction).  Past surgery on the kidney or the ureters, such as gastric bypass surgery.  What increases the risk? The following factors make you more likely to develop kidney stones:  Having had a kidney stone in the past.  Having a family history of kidney stones.  Not drinking enough water.  Eating a diet that is high in protein, salt (sodium), or sugar.  Being overweight or obese.  What are the signs or symptoms? Symptoms of a kidney stone may include:  Nausea.  Vomiting.  Blood in the urine (hematuria).  Pain in the side of the abdomen, right below the ribs (flank pain). Pain usually spreads (radiates) to the groin.  Needing to urinate frequently or urgently.  How is this diagnosed? This condition may be diagnosed based on:  Your medical history.  A physical exam.  Blood tests.  Urine tests.  CT scan.  Abdominal X-ray.  A procedure to examine the inside of the  bladder (cystoscopy).  How is this treated? Treatment for kidney stones depends on the size, location, and makeup of the stones. Treatment may involve:  Analyzing your urine before and after you pass the stone through urination.  Being monitored at the hospital until you pass the stone through urination.  Increasing your fluid intake and decreasing the amount of calcium and protein in your diet.  A procedure to break up kidney stones in the bladder using: ? A focused beam of light (laser therapy). ? Shock waves (extracorporeal shock wave lithotripsy).  Surgery to remove kidney stones. This may be needed if you have severe pain or have stones that block your urinary tract.  Follow these instructions at home: Eating and drinking   Drink enough fluid to keep your urine clear or pale yellow. This will help you to pass the kidney stone.  If directed, change your diet. This may include: ? Limiting how much sodium you eat. ? Eating more fruits and vegetables. ? Limiting how much meat, poultry, fish, and eggs you eat.  Follow instructions from your health care provider about eating or drinking restrictions. General instructions  Collect urine samples as told by your health care provider. You may need to collect a urine sample: ? 24 hours after you pass the stone. ? 8-12 weeks after passing the kidney stone, and every 6-12 months after that.  Strain your urine every time you urinate, for as long as directed. Use the strainer that your health care provider recommends.  Do not throw out  the kidney stone after passing it. Keep the stone so it can be tested by your health care provider. Testing the makeup of your kidney stone may help prevent you from getting kidney stones in the future.  Take over-the-counter and prescription medicines only as told by your health care provider.  Keep all follow-up visits as told by your health care provider. This is important. You may need follow-up  X-rays or ultrasounds to make sure that your stone has passed. How is this prevented? To prevent another kidney stone:  Drink enough fluid to keep your urine clear or pale yellow. This is the best way to prevent kidney stones.  Eat a healthy diet and follow recommendations from your health care provider about foods to avoid. You may be instructed to eat a low-protein diet. Recommendations vary depending on the type of kidney stone that you have.  Maintain a healthy weight.  Contact a health care provider if:  You have pain that gets worse or does not get better with medicine. Get help right away if:  You have a fever or chills.  You develop severe pain.  You develop new abdominal pain.  You faint.  You are unable to urinate. This information is not intended to replace advice given to you by your health care provider. Make sure you discuss any questions you have with your health care provider. Document Released: 10/09/2005 Document Revised: 04/28/2016 Document Reviewed: 03/24/2016 Elsevier Interactive Patient Education  2017 [place].   Lithotripsy Lithotripsy is a treatment that can sometimes help eliminate kidney stones and the pain that they cause. A form of lithotripsy, also known as extracorporeal shock wave lithotripsy, is a nonsurgical procedure that crushes a kidney stone with shock waves. These shock waves pass through your body and focus on the kidney stone. They cause the kidney stone to break up while it is still in the urinary tract. This makes it easier for the smaller pieces of stone to pass in the urine. Tell a health care provider about:  Any allergies you have.  All medicines you are taking, including vitamins, herbs, eye drops, creams, and over-the-counter medicines.  Any blood disorders you have.  Any surgeries you have had.  Any medical conditions you have.  Whether you are pregnant or may be pregnant.  Any problems you or family members have had  with anesthetic medicines. What are the risks? Generally, this is a safe procedure. However, problems may occur, including:  Infection.  Bleeding of the kidney.  Bruising of the kidney or skin.  Scarring of the kidney, which can lead to: ? Increased blood pressure. ? Poor kidney function. ? Return (recurrence) of kidney stones.  Damage to other structures or organs, such as the liver, colon, spleen, or pancreas.  Blockage (obstruction) of the the tube that carries urine from the kidney to the bladder (ureter).  Failure of the kidney stone to break into pieces (fragments).  What happens before the procedure? Staying hydrated Follow instructions from your health care provider about hydration, which may include:  Up to 2 hours before the procedure - you may continue to drink clear liquids, such as water, clear fruit juice, black coffee, and plain tea.  Eating and drinking restrictions Follow instructions from your health care provider about eating and drinking, which may include:  8 hours before the procedure - stop eating heavy meals or foods such as meat, fried foods, or fatty foods.  6 hours before the procedure - stop eating light meals  or foods, such as toast or cereal.  6 hours before the procedure - stop drinking milk or drinks that contain milk.  2 hours before the procedure - stop drinking clear liquids.  General instructions  Plan to have someone take you home from the hospital or clinic.  Ask your health care provider about: ? Changing or stopping your regular medicines. This is especially important if you are taking diabetes medicines or blood thinners. ? Taking medicines such as aspirin and ibuprofen. These medicines and other NSAIDs can thin your blood. Do not take these medicines for 7 days before your procedure if your health care provider instructs you not to.  You may have tests, such as: ? Blood tests. ? Urine tests. ? Imaging tests, such as a CT  scan. What happens during the procedure?  To lower your risk of infection: ? Your health care team will wash or sanitize their hands. ? Your skin will be washed with soap.  An IV tube will be inserted into one of your veins. This tube will give you fluids and medicines.  You will be given one or more of the following: ? A medicine to help you relax (sedative). ? A medicine to make you fall asleep (general anesthetic).  A water-filled cushion may be placed behind your kidney or on your abdomen. In some cases you may be placed in a tub of lukewarm water.  Your body will be positioned in a way that makes it easy to target the kidney stone.  A flexible tube with holes in it (stent) may be placed in the ureter. This will help keep urine flowing from the kidney if the fragments of the stone have been blocking the ureter.  An X-ray or ultrasound exam will be done to locate your stone.  Shock waves will be aimed at the stone. If you are awake, you may feel a tapping sensation as the shock waves pass through your body. The procedure may vary among health care providers and hospitals. What happens after the procedure?  You may have an X-ray to see whether the procedure was able to break up the kidney stone and how much of the stone has passed. If large stone fragments remain after treatment, you may need to have a second procedure at a later time.  Your blood pressure, heart rate, breathing rate, and blood oxygen level will be monitored until the medicines you were given have worn off.  You may be given antibiotics or pain medicine as needed.  If a stent was placed in your ureter during surgery, it may stay in place for a few weeks.  You may need strain your urine to collect pieces of the kidney stone for testing.  You will need to drink plenty of water.  Do not drive for 24 hours if you were given a sedative. Summary  Lithotripsy is a treatment that can sometimes help eliminate kidney  stones and the pain that they cause.  A form of lithotripsy, also known as extracorporeal shock wave lithotripsy, is a nonsurgical procedure that crushes a kidney stone with shock waves.  Generally, this is a safe procedure. However, problems may occur, including damage to the kidney or other organs, infection, or obstruction of the tube that carries urine from the kidney to the bladder (ureter).  When you go home, you will need to drink plenty of water. You may be asked to strain your urine to collect pieces of the kidney stone for testing. This information  is not intended to replace advice given to you by your health care provider. Make sure you discuss any questions you have with your health care provider. Document Released: 10/06/2000 Document Revised: 08/30/2016 Document Reviewed: 08/30/2016 Elsevier Interactive Patient Education  2017 [place] After This sheet gives you information about how to care for yourself after your procedure. Your health care provider may also give you more specific instructions. If you have problems or questions, contact your health care provider. What can I expect after the procedure? After the procedure, it is common to have:  Some blood in your urine. This should only last for a few days.  Soreness in your back, sides, or upper abdomen for a few days.  Blotches or bruises on your back where the pressure wave entered the skin.  Pain, discomfort, or nausea when pieces (fragments) of the kidney stone move through the tube that carries urine from the kidney to the bladder (ureter). Stone fragments may pass soon after the procedure, but they may continue to pass for up to 4-8 weeks. ? If you have severe pain or nausea, contact your health care provider. This may be caused by a large stone that was not broken up, and this may mean that you need more treatment.  Some pain or discomfort during urination.  Some pain or discomfort in the  lower abdomen or (in men) at the base of the penis.  Follow these instructions at home: Medicines  Take over-the-counter and prescription medicines only as told by your health care provider.  If you were prescribed an antibiotic medicine, take it as told by your health care provider. Do not stop taking the antibiotic even if you start to feel better.  Do not drive for 24 hours if you were given a medicine to help you relax (sedative).  Do not drive or use heavy machinery while taking prescription pain medicine. Eating and drinking  Drink enough water and fluids to keep your urine clear or pale yellow. This helps any remaining pieces of the stone to pass. It can also help prevent new stones from forming.  Eat plenty of fresh fruits and vegetables.  Follow instructions from your health care provider about eating and drinking restrictions. You may be instructed: ? To reduce how much salt (sodium) you eat or drink. Check ingredients and nutrition facts on packaged foods and beverages. ? To reduce how much meat you eat.  Eat the recommended amount of calcium for your age and gender. Ask your health care provider how much calcium you should have. General instructions  Get plenty of rest.  Most people can resume normal activities 1-2 days after the procedure. Ask your health care provider what activities are safe for you.  If directed, strain all urine through the strainer that was provided by your health care provider. ? Keep all fragments for your health care provider to see. Any stones that are found may be sent to a medical lab for examination. The stone may be as small as a grain of salt.  Keep all follow-up visits as told by your health care provider. This is important. Contact a health care provider if:  You have pain that is severe or does not get better with medicine.  You have nausea that is severe or does not go away.  You have blood in your urine longer than your health  care provider told you to expect.  You have more blood in your urine.  You  have pain during urination that does not go away.  You urinate more frequently than usual and this does not go away.  You develop a rash or any other possible signs of an allergic reaction. Get help right away if:  You have severe pain in your back, sides, or upper abdomen.  You have severe pain while urinating.  Your urine is very dark red.  You have blood in your stool (feces).  You cannot pass any urine at all.  You feel a strong urge to urinate after emptying your bladder.  You have a fever or chills.  You develop shortness of breath, difficulty breathing, or chest pain.  You have severe nausea that leads to persistent vomiting.  You faint. Summary  After this procedure, it is common to have some pain, discomfort, or nausea when pieces (fragments) of the kidney stone move through the tube that carries urine from the kidney to the bladder (ureter). If this pain or nausea is severe, however, you should contact your health care provider.  Most people can resume normal activities 1-2 days after the procedure. Ask your health care provider what activities are safe for you.  Drink enough water and fluids to keep your urine clear or pale yellow. This helps any remaining pieces of the stone to pass, and it can help prevent new stones from forming.  If directed, strain your urine and keep all fragments for your health care provider to see. Fragments or stones may be as small as a grain of salt.  Get help right away if you have severe pain in your back, sides, or upper abdomen or have severe pain while urinating. This information is not intended to replace advice given to you by your health care provider. Make sure you discuss any questions you have with your health care provider. Document Released: 10/29/2007 Document Revised: 08/30/2016 Document Reviewed: 08/30/2016 Elsevier Interactive Patient Education   2017 [place]   1) The drugs that you were given will stay in your system until tomorrow so for the next 24 hours you should not:  A) Drive an automobile B) Make any legal decisions C) Drink any alcoholic beverage   2) You may resume regular meals tomorrow.  Today it is better to start with liquids and gradually work up to solid foods.  You may eat anything you prefer, but it is better to start with liquids, then soup and crackers, and gradually work up to solid foods.   3) Please notify your doctor immediately if you have any unusual bleeding, trouble breathing, redness and pain at the surgery site, drainage, fever, or pain not relieved by medication.    4) Additional Instructions:        Please contact your physician with any problems or Same Day Surgery at [phone], Monday through Friday 6 am to 4 pm, or [place] [facility] number at [phone].

## 2017-05-25 ENCOUNTER — Encounter: Payer: Self-pay | Admitting: Urology

## 2017-09-17 ENCOUNTER — Other Ambulatory Visit: Payer: Self-pay

## 2017-09-17 DIAGNOSIS — Z299 Encounter for prophylactic measures, unspecified: Secondary | ICD-10-CM

## 2017-09-17 NOTE — Progress Notes (Signed)
Patient came in to have blood drawn for testing per Dr. Olin Pia orders.

## 2017-09-18 LAB — CMP12+LP+TP+TSH+6AC+CBC/D/PLT
ALBUMIN: 4.4 g/dL (ref 3.6–4.8)
ALK PHOS: 64 IU/L (ref 39–117)
ALT: 28 IU/L (ref 0–44)
AST: 20 IU/L (ref 0–40)
Albumin/Globulin Ratio: 1.6 (ref 1.2–2.2)
BASOS: 2 %
BILIRUBIN TOTAL: 0.3 mg/dL (ref 0.0–1.2)
BUN/Creatinine Ratio: 15 (ref 10–24)
BUN: 15 mg/dL (ref 8–27)
Basophils Absolute: 0.1 10*3/uL (ref 0.0–0.2)
CHLORIDE: 100 mmol/L (ref 96–106)
CHOLESTEROL TOTAL: 165 mg/dL (ref 100–199)
Calcium: 9.8 mg/dL (ref 8.6–10.2)
Chol/HDL Ratio: 4 ratio (ref 0.0–5.0)
Creatinine, Ser: 1 mg/dL (ref 0.76–1.27)
EOS (ABSOLUTE): 0.2 10*3/uL (ref 0.0–0.4)
ESTIMATED CHD RISK: 0.7 times avg. (ref 0.0–1.0)
Eos: 4 %
FREE THYROXINE INDEX: 1.6 (ref 1.2–4.9)
GFR calc Af Amer: 94 mL/min/{1.73_m2} (ref >59)
GFR calc non Af Amer: 81 mL/min/{1.73_m2} (ref >59)
GGT: 29 IU/L (ref 0–65)
GLOBULIN, TOTAL: 2.7 g/dL (ref 1.5–4.5)
Glucose: 198 mg/dL — ABNORMAL HIGH (ref 65–99)
HDL: 41 mg/dL (ref >39)
HEMATOCRIT: 47.2 % (ref 37.5–51.0)
Hemoglobin: 15.5 g/dL (ref 13.0–17.7)
IMMATURE GRANS (ABS): 0.2 10*3/uL — AB (ref 0.0–0.1)
IMMATURE GRANULOCYTES: 3 %
IRON: 71 ug/dL (ref 38–169)
LDH: 176 IU/L (ref 121–224)
LDL Calculated: 67 mg/dL (ref 0–99)
LYMPHS ABS: 1.6 10*3/uL (ref 0.7–3.1)
LYMPHS: 28 %
MCH: 31.8 pg (ref 26.6–33.0)
MCHC: 32.8 g/dL (ref 31.5–35.7)
MCV: 97 fL (ref 79–97)
MONOS ABS: 0.9 10*3/uL (ref 0.1–0.9)
Monocytes: 16 %
NEUTROS ABS: 2.7 10*3/uL (ref 1.4–7.0)
NEUTROS PCT: 47 %
POTASSIUM: 4.4 mmol/L (ref 3.5–5.2)
Phosphorus: 3.2 mg/dL (ref 2.5–4.5)
Platelets: 184 10*3/uL (ref 150–379)
RBC: 4.88 x10E6/uL (ref 4.14–5.80)
RDW: 13.6 % (ref 12.3–15.4)
SODIUM: 137 mmol/L (ref 134–144)
T3 Uptake Ratio: 25 % (ref 24–39)
T4 TOTAL: 6.3 ug/dL (ref 4.5–12.0)
TOTAL PROTEIN: 7.1 g/dL (ref 6.0–8.5)
TRIGLYCERIDES: 285 mg/dL — AB (ref 0–149)
TSH: 1.36 u[IU]/mL (ref 0.450–4.500)
Uric Acid: 5 mg/dL (ref 3.7–8.6)
VLDL Cholesterol Cal: 57 mg/dL — ABNORMAL HIGH (ref 5–40)
WBC: 5.6 10*3/uL (ref 3.4–10.8)

## 2017-09-18 LAB — MICROALBUMIN / CREATININE URINE RATIO
CREATININE, UR: 46 mg/dL
MICROALB/CREAT RATIO: 143.9 mg/g{creat} — AB (ref 0.0–30.0)
Microalbumin, Urine: 66.2 ug/mL

## 2017-09-18 LAB — URINALYSIS, ROUTINE W REFLEX MICROSCOPIC
Bilirubin, UA: NEGATIVE
GLUCOSE, UA: NEGATIVE
KETONES UA: NEGATIVE
LEUKOCYTES UA: NEGATIVE
NITRITE UA: NEGATIVE
PROTEIN UA: NEGATIVE
RBC, UA: NEGATIVE
SPEC GRAV UA: 1.011 (ref 1.005–1.030)
Urobilinogen, Ur: 0.2 mg/dL (ref 0.2–1.0)
pH, UA: 7 (ref 5.0–7.5)

## 2017-09-18 LAB — HGB A1C W/O EAG: HEMOGLOBIN A1C: 8.2 % — AB (ref 4.8–5.6)

## 2017-12-24 ENCOUNTER — Other Ambulatory Visit: Payer: Self-pay

## 2017-12-24 DIAGNOSIS — E1165 Type 2 diabetes mellitus with hyperglycemia: Principal | ICD-10-CM

## 2017-12-24 DIAGNOSIS — IMO0002 Reserved for concepts with insufficient information to code with codable children: Secondary | ICD-10-CM

## 2017-12-24 DIAGNOSIS — E1129 Type 2 diabetes mellitus with other diabetic kidney complication: Secondary | ICD-10-CM

## 2017-12-25 LAB — COMPREHENSIVE METABOLIC PANEL
ALT: 32 IU/L (ref 0–44)
AST: 24 IU/L (ref 0–40)
Albumin/Globulin Ratio: 1.6 (ref 1.2–2.2)
Albumin: 4.2 g/dL (ref 3.6–4.8)
Alkaline Phosphatase: 48 IU/L (ref 39–117)
BUN/Creatinine Ratio: 22 (ref 10–24)
BUN: 23 mg/dL (ref 8–27)
Bilirubin Total: 0.4 mg/dL (ref 0.0–1.2)
CALCIUM: 9.4 mg/dL (ref 8.6–10.2)
CO2: 21 mmol/L (ref 20–29)
CREATININE: 1.04 mg/dL (ref 0.76–1.27)
Chloride: 103 mmol/L (ref 96–106)
GFR calc Af Amer: 89 mL/min/{1.73_m2} (ref >59)
GFR, EST NON AFRICAN AMERICAN: 77 mL/min/{1.73_m2} (ref >59)
GLOBULIN, TOTAL: 2.7 g/dL (ref 1.5–4.5)
GLUCOSE: 164 mg/dL — AB (ref 65–99)
Potassium: 4.3 mmol/L (ref 3.5–5.2)
Sodium: 138 mmol/L (ref 134–144)
TOTAL PROTEIN: 6.9 g/dL (ref 6.0–8.5)

## 2017-12-25 LAB — LIPID PANEL
CHOL/HDL RATIO: 4.8 ratio (ref 0.0–5.0)
CHOLESTEROL TOTAL: 179 mg/dL (ref 100–199)
HDL: 37 mg/dL — ABNORMAL LOW (ref >39)
LDL CALC: 86 mg/dL (ref 0–99)
Triglycerides: 278 mg/dL — ABNORMAL HIGH (ref 0–149)
VLDL CHOLESTEROL CAL: 56 mg/dL — AB (ref 5–40)

## 2017-12-25 LAB — HGB A1C W/O EAG: Hgb A1c MFr Bld: 7.5 % — ABNORMAL HIGH (ref 4.8–5.6)

## 2018-04-08 ENCOUNTER — Other Ambulatory Visit: Payer: Self-pay

## 2018-04-08 DIAGNOSIS — E7800 Pure hypercholesterolemia, unspecified: Secondary | ICD-10-CM

## 2018-04-08 DIAGNOSIS — E1165 Type 2 diabetes mellitus with hyperglycemia: Principal | ICD-10-CM

## 2018-04-08 DIAGNOSIS — IMO0002 Reserved for concepts with insufficient information to code with codable children: Secondary | ICD-10-CM

## 2018-04-08 DIAGNOSIS — E1129 Type 2 diabetes mellitus with other diabetic kidney complication: Secondary | ICD-10-CM

## 2018-04-09 LAB — COMPREHENSIVE METABOLIC PANEL
ALK PHOS: 54 IU/L (ref 39–117)
ALT: 28 IU/L (ref 0–44)
AST: 20 IU/L (ref 0–40)
Albumin/Globulin Ratio: 1.6 (ref 1.2–2.2)
Albumin: 4.2 g/dL (ref 3.6–4.8)
BUN/Creatinine Ratio: 28 — ABNORMAL HIGH (ref 10–24)
BUN: 25 mg/dL (ref 8–27)
Bilirubin Total: 0.3 mg/dL (ref 0.0–1.2)
CO2: 22 mmol/L (ref 20–29)
CREATININE: 0.9 mg/dL (ref 0.76–1.27)
Calcium: 9.6 mg/dL (ref 8.6–10.2)
Chloride: 97 mmol/L (ref 96–106)
GFR calc Af Amer: 106 mL/min/{1.73_m2} (ref >59)
GFR calc non Af Amer: 92 mL/min/{1.73_m2} (ref >59)
GLUCOSE: 197 mg/dL — AB (ref 65–99)
Globulin, Total: 2.6 g/dL (ref 1.5–4.5)
Potassium: 5 mmol/L (ref 3.5–5.2)
Sodium: 141 mmol/L (ref 134–144)
Total Protein: 6.8 g/dL (ref 6.0–8.5)

## 2018-04-09 LAB — LIPID PANEL
CHOLESTEROL TOTAL: 173 mg/dL (ref 100–199)
Chol/HDL Ratio: 4.2 ratio (ref 0.0–5.0)
HDL: 41 mg/dL (ref >39)
LDL Calculated: 80 mg/dL (ref 0–99)
Triglycerides: 261 mg/dL — ABNORMAL HIGH (ref 0–149)
VLDL CHOLESTEROL CAL: 52 mg/dL — AB (ref 5–40)

## 2018-04-09 LAB — URINALYSIS, ROUTINE W REFLEX MICROSCOPIC
Bilirubin, UA: NEGATIVE
Glucose, UA: NEGATIVE
Ketones, UA: NEGATIVE
Leukocytes, UA: NEGATIVE
Nitrite, UA: NEGATIVE
PH UA: 6.5 (ref 5.0–7.5)
Protein, UA: NEGATIVE
RBC UA: NEGATIVE
Specific Gravity, UA: 1.022 (ref 1.005–1.030)
UUROB: 0.2 mg/dL (ref 0.2–1.0)

## 2018-04-09 LAB — MICROALBUMIN / CREATININE URINE RATIO
Creatinine, Urine: 103.7 mg/dL
Microalb/Creat Ratio: 34 mg/g creat — ABNORMAL HIGH (ref 0.0–30.0)
Microalbumin, Urine: 35.3 ug/mL

## 2018-04-09 LAB — HGB A1C W/O EAG: HEMOGLOBIN A1C: 7.9 % — AB (ref 4.8–5.6)

## 2018-10-18 ENCOUNTER — Other Ambulatory Visit: Payer: Self-pay | Admitting: Physician Assistant

## 2018-10-18 DIAGNOSIS — H9209 Otalgia, unspecified ear: Secondary | ICD-10-CM

## 2018-10-28 ENCOUNTER — Ambulatory Visit
Admission: RE | Admit: 2018-10-28 | Discharge: 2018-10-28 | Disposition: A | Discharge status: Home / Self Care | Payer: Managed Care, Other (non HMO) | Source: Ambulatory Visit | Attending: Physician Assistant | Admitting: Physician Assistant

## 2018-10-28 DIAGNOSIS — H9209 Otalgia, unspecified ear: Secondary | ICD-10-CM | POA: Diagnosis present

## 2018-10-28 LAB — POCT I-STAT CREATININE: Creatinine, Ser: 1 mg/dL (ref 0.61–1.24)

## 2018-10-28 IMAGING — CT CT NECK W/ CM
4 of 6 series · 13 of 33 positions shown, 15 images · IV contrast (iopamidol)
Comparison: None.

CLINICAL DATA: Otogenic otalgia, unspecified laterality [O1]
([O1]-CM)

EXAM:
CT NECK WITH CONTRAST
TECHNIQUE: Multidetector CT imaging of the neck was performed using the
standard protocol following the bolus administration of intravenous
contrast.
CONTRAST:  75mL [O1] IOPAMIDOL ([O1]) INJECTION 61%

[Series 3: axial bone neck · axial · 0.54mm/px · z∈[-717,-617]mm · 2 of 150 slices shown]
[im 50/150  bone]
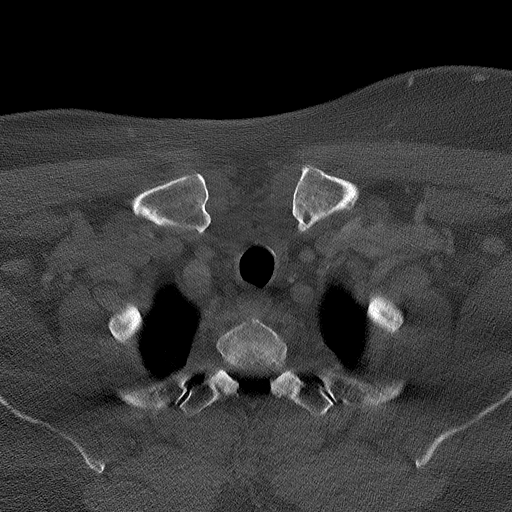
[im 100/150  bone]
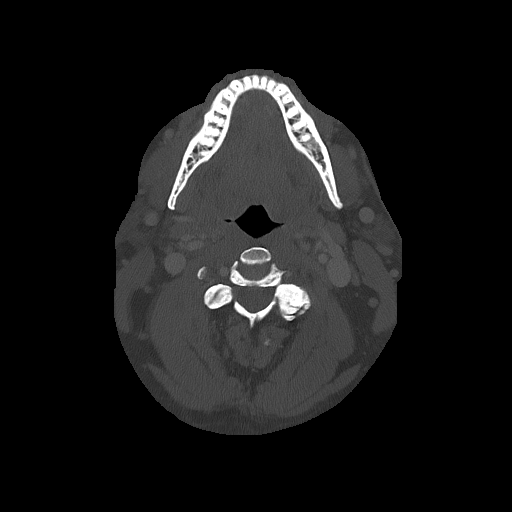

[Series 5: coronal neck neck (person_name) · coronal · 0.52mm/px · 3 of 136 slices shown]
[im 37/136  bone]
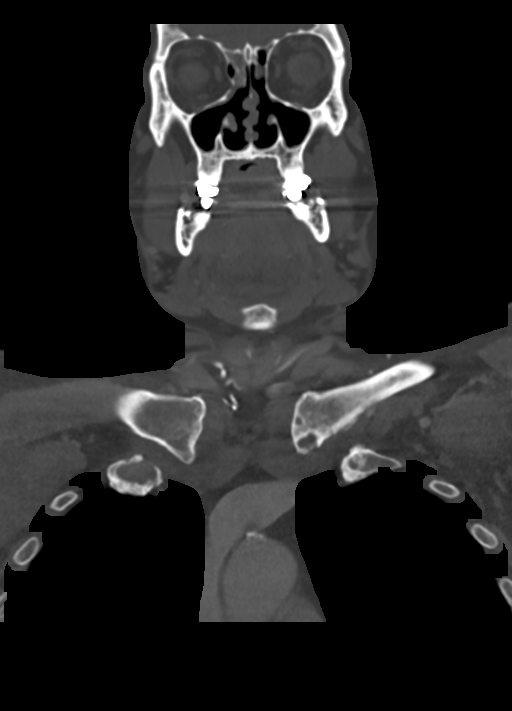
[im 58/136  bone]
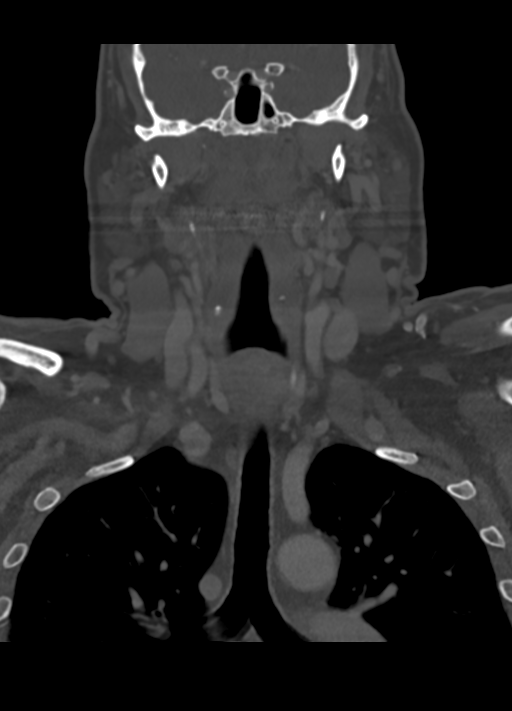
[im 78/136  bone]
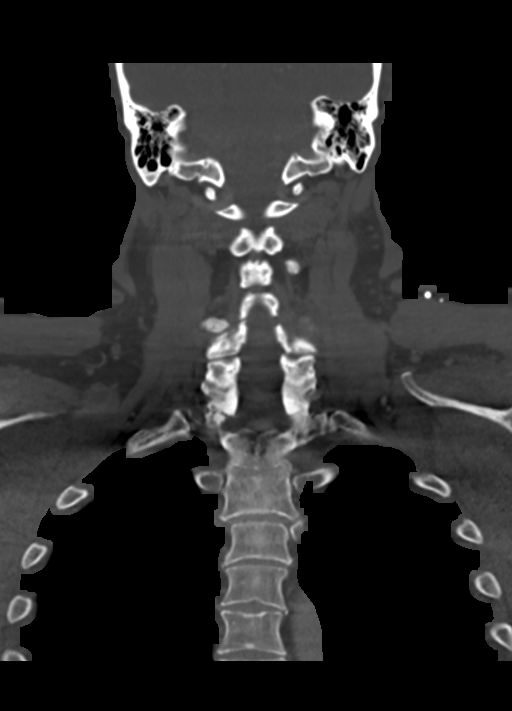

[Series 7: sagittal neck neck (person_name) · sagittal · 0.53mm/px · 5 of 132 slices shown, 6 images]
[im 44/132  bone]
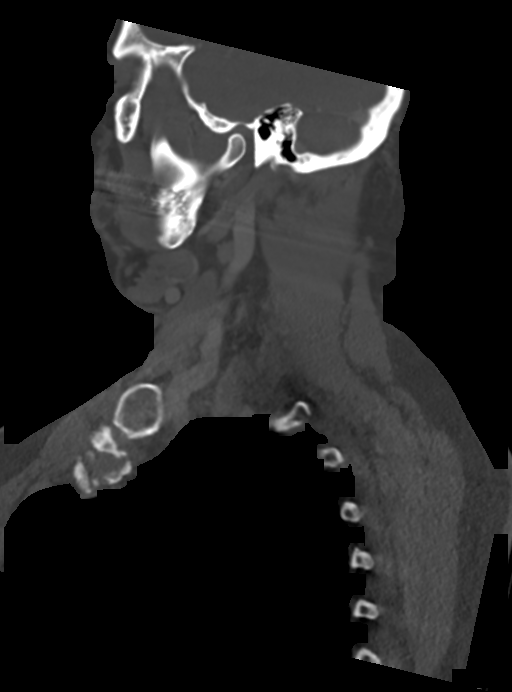
[im 55/132  bone]
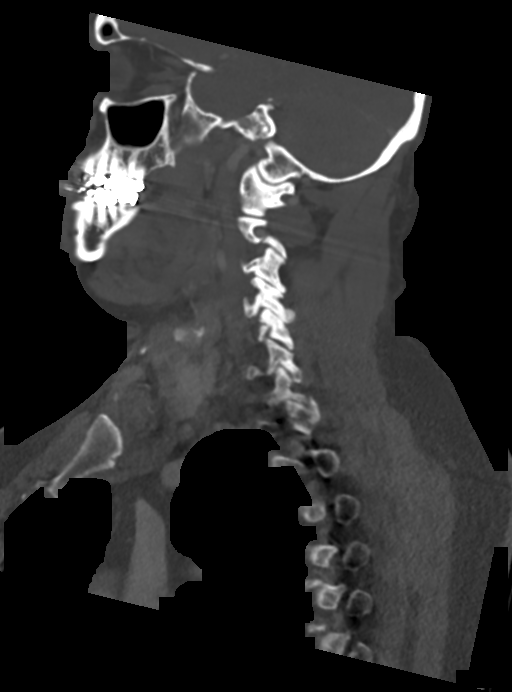
[im 66/132  soft-tissue]
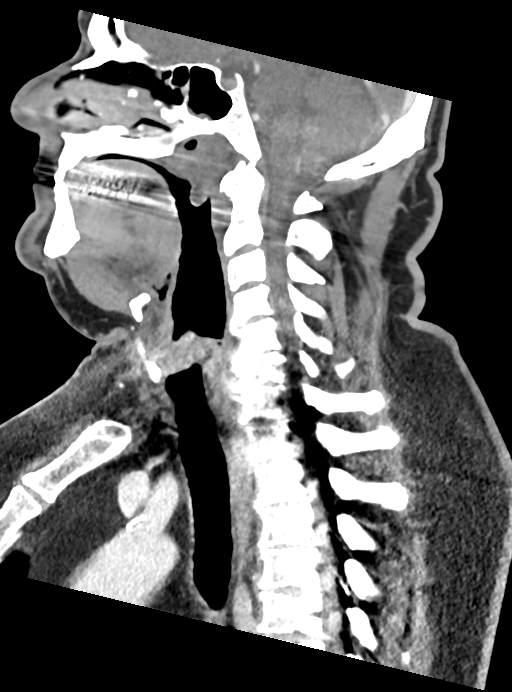
[im 66/132  bone]
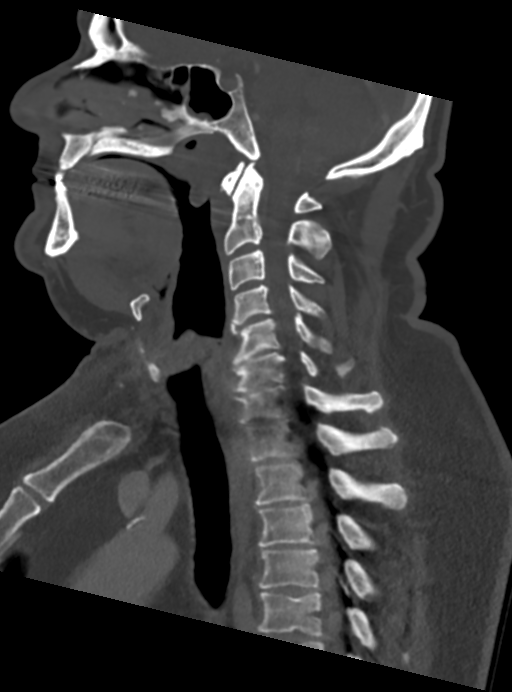
[im 77/132  bone]
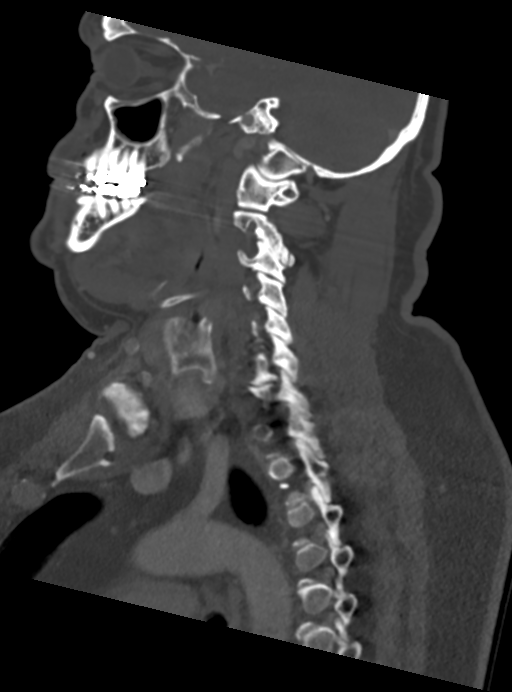
[im 88/132  bone]
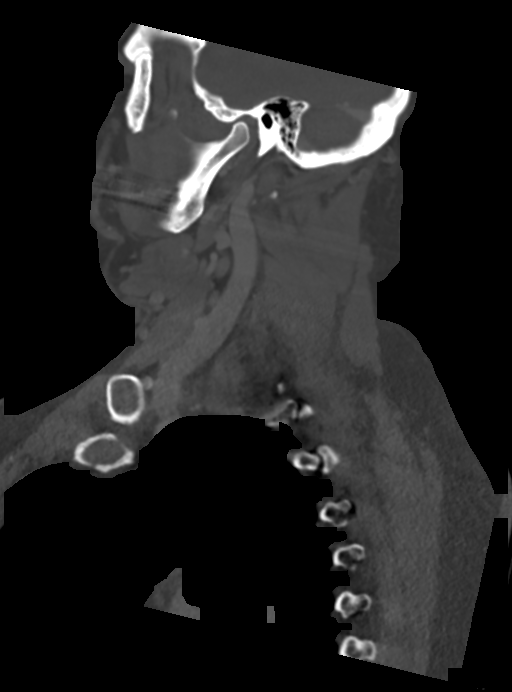

[Series 9: ax oropharynx neck neck (person_name) · axial · 0.52mm/px · z∈[-790,-611]mm · 3 of 184 slices shown, 4 images]
[im 46/184  soft-tissue]
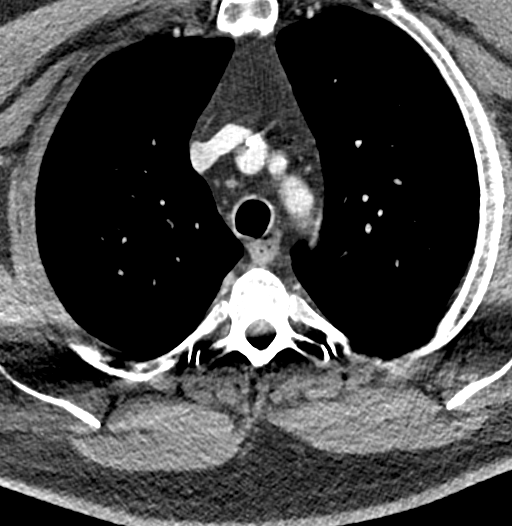
[im 46/184  bone]
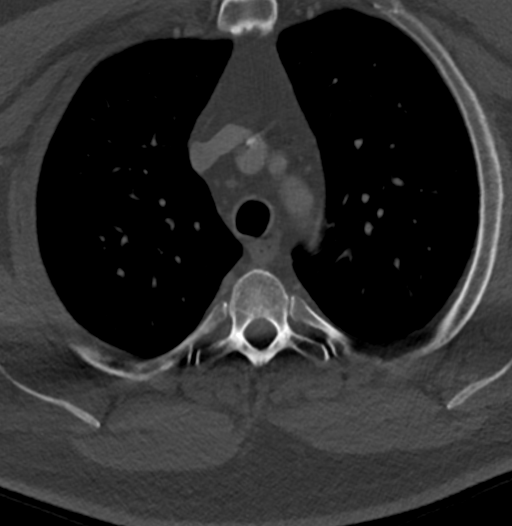
[im 92/184  bone]
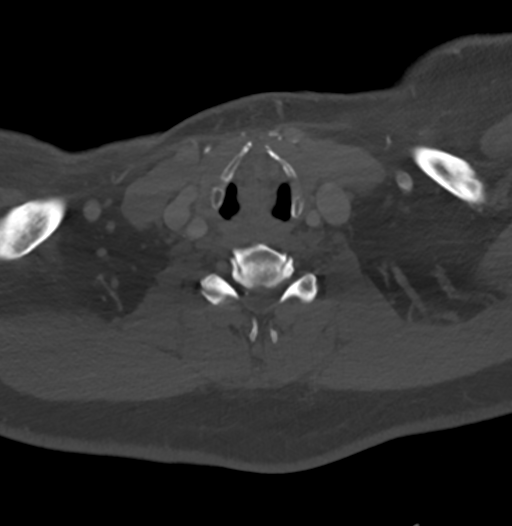
[im 138/184  bone]
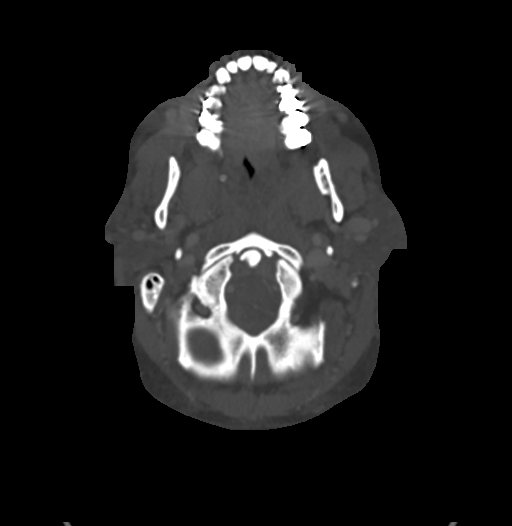

[13 of 33 positions shown; findings below may reference images not displayed]

FINDINGS: Pharynx and larynx: Normal. No mass or swelling.

Salivary glands: No inflammation, mass, or stone.

Thyroid: Negative

Lymph nodes: No enlarged lymph nodes in the neck.

Vascular: Normal vascular enhancement.

Limited intracranial: Negative

Visualized orbits: Negative

Mastoids and visualized paranasal sinuses: Medial antrostomy
bilaterally. Partial ethmoidectomy bilaterally. Moderate mucosal
edema throughout the paranasal sinuses.

Mastoid sinus and middle ear clear bilaterally. External auditory
canal normal bilaterally.

Skeleton: Mild to moderate degenerative changes in the cervical
spine. No acute skeletal abnormality.

Upper chest: Lung apices clear bilaterally.

Other: None
IMPRESSION: 1. Negative for mass or adenopathy in neck
2. Prior sinus surgery. Mucosal edema throughout the paranasal
sinuses.
3. Mastoid sinus and middle ear clear bilaterally.

## 2018-10-28 MED ORDER — IOPAMIDOL (ISOVUE-300) INJECTION 61%
75.0000 mL | Freq: Once | INTRAVENOUS | Status: AC | PRN
  Administered 2018-10-28: 75 mL via INTRAVENOUS

## 2019-04-01 ENCOUNTER — Other Ambulatory Visit: Payer: Self-pay

## 2019-04-01 DIAGNOSIS — G4730 Sleep apnea, unspecified: Secondary | ICD-10-CM | POA: Insufficient documentation

## 2019-04-01 DIAGNOSIS — E785 Hyperlipidemia, unspecified: Secondary | ICD-10-CM | POA: Insufficient documentation

## 2019-04-01 DIAGNOSIS — I10 Essential (primary) hypertension: Secondary | ICD-10-CM | POA: Insufficient documentation

## 2019-04-01 DIAGNOSIS — J329 Chronic sinusitis, unspecified: Secondary | ICD-10-CM | POA: Insufficient documentation

## 2019-04-02 ENCOUNTER — Other Ambulatory Visit: Payer: Managed Care, Other (non HMO)

## 2019-04-02 ENCOUNTER — Other Ambulatory Visit: Payer: Self-pay

## 2019-04-02 DIAGNOSIS — Z79899 Other long term (current) drug therapy: Secondary | ICD-10-CM

## 2019-04-02 DIAGNOSIS — E7800 Pure hypercholesterolemia, unspecified: Secondary | ICD-10-CM

## 2019-04-02 DIAGNOSIS — E1165 Type 2 diabetes mellitus with hyperglycemia: Secondary | ICD-10-CM

## 2019-04-02 DIAGNOSIS — E1129 Type 2 diabetes mellitus with other diabetic kidney complication: Secondary | ICD-10-CM

## 2019-04-02 DIAGNOSIS — G4733 Obstructive sleep apnea (adult) (pediatric): Secondary | ICD-10-CM | POA: Diagnosis not present

## 2019-04-02 DIAGNOSIS — IMO0002 Reserved for concepts with insufficient information to code with codable children: Secondary | ICD-10-CM

## 2019-04-03 LAB — URINALYSIS, ROUTINE W REFLEX MICROSCOPIC
Bilirubin, UA: NEGATIVE
Glucose, UA: NEGATIVE
Ketones, UA: NEGATIVE
Leukocytes,UA: NEGATIVE
Nitrite, UA: NEGATIVE
Protein,UA: NEGATIVE
RBC, UA: NEGATIVE
Specific Gravity, UA: 1.014 (ref 1.005–1.030)
Urobilinogen, Ur: 0.2 mg/dL (ref 0.2–1.0)
pH, UA: 6 (ref 5.0–7.5)

## 2019-04-03 LAB — LIPID PANEL WITH LDL/HDL RATIO
Cholesterol, Total: 165 mg/dL (ref 100–199)
HDL: 48 mg/dL (ref >39)
LDL Calculated: 79 mg/dL (ref 0–99)
LDl/HDL Ratio: 1.6 ratio (ref 0.0–3.6)
Triglycerides: 190 mg/dL — ABNORMAL HIGH (ref 0–149)
VLDL Cholesterol Cal: 38 mg/dL (ref 5–40)

## 2019-04-03 LAB — CBC WITH DIFFERENTIAL/PLATELET
Basophils Absolute: 0.1 10*3/uL (ref 0.0–0.2)
Basos: 1 %
EOS (ABSOLUTE): 0.2 10*3/uL (ref 0.0–0.4)
Eos: 3 %
Hematocrit: 46.6 % (ref 37.5–51.0)
Hemoglobin: 15.8 g/dL (ref 13.0–17.7)
Immature Grans (Abs): 0.2 10*3/uL — ABNORMAL HIGH (ref 0.0–0.1)
Immature Granulocytes: 3 %
Lymphocytes Absolute: 2.1 10*3/uL (ref 0.7–3.1)
Lymphs: 28 %
MCH: 32.3 pg (ref 26.6–33.0)
MCHC: 33.9 g/dL (ref 31.5–35.7)
MCV: 95 fL (ref 79–97)
Monocytes Absolute: 0.6 10*3/uL (ref 0.1–0.9)
Monocytes: 8 %
Neutrophils Absolute: 4.2 10*3/uL (ref 1.4–7.0)
Neutrophils: 57 %
Platelets: 204 10*3/uL (ref 150–450)
RBC: 4.89 x10E6/uL (ref 4.14–5.80)
RDW: 12.5 % (ref 11.6–15.4)
WBC: 7.3 10*3/uL (ref 3.4–10.8)

## 2019-04-03 LAB — COMPREHENSIVE METABOLIC PANEL
ALT: 24 IU/L (ref 0–44)
AST: 20 IU/L (ref 0–40)
Albumin/Globulin Ratio: 2 (ref 1.2–2.2)
Albumin: 4.5 g/dL (ref 3.8–4.8)
Alkaline Phosphatase: 64 IU/L (ref 39–117)
BUN/Creatinine Ratio: 20 (ref 10–24)
BUN: 20 mg/dL (ref 8–27)
Bilirubin Total: <0.2 mg/dL (ref 0.0–1.2)
CO2: 20 mmol/L (ref 20–29)
Calcium: 9.2 mg/dL (ref 8.6–10.2)
Chloride: 101 mmol/L (ref 96–106)
Creatinine, Ser: 1.01 mg/dL (ref 0.76–1.27)
GFR calc Af Amer: 92 mL/min/{1.73_m2} (ref >59)
GFR calc non Af Amer: 79 mL/min/{1.73_m2} (ref >59)
Globulin, Total: 2.3 g/dL (ref 1.5–4.5)
Glucose: 160 mg/dL — ABNORMAL HIGH (ref 65–99)
Potassium: 4.4 mmol/L (ref 3.5–5.2)
Sodium: 136 mmol/L (ref 134–144)
Total Protein: 6.8 g/dL (ref 6.0–8.5)

## 2019-04-03 LAB — HGB A1C W/O EAG: Hgb A1c MFr Bld: 7.5 % — ABNORMAL HIGH (ref 4.8–5.6)

## 2019-11-26 ENCOUNTER — Other Ambulatory Visit: Payer: Managed Care, Other (non HMO)

## 2019-11-26 ENCOUNTER — Other Ambulatory Visit: Payer: Self-pay

## 2019-11-26 DIAGNOSIS — Z79899 Other long term (current) drug therapy: Secondary | ICD-10-CM

## 2019-11-26 DIAGNOSIS — Z85038 Personal history of other malignant neoplasm of large intestine: Secondary | ICD-10-CM

## 2019-11-26 DIAGNOSIS — E1129 Type 2 diabetes mellitus with other diabetic kidney complication: Secondary | ICD-10-CM

## 2019-11-26 DIAGNOSIS — IMO0002 Reserved for concepts with insufficient information to code with codable children: Secondary | ICD-10-CM

## 2019-11-26 DIAGNOSIS — G4730 Sleep apnea, unspecified: Secondary | ICD-10-CM

## 2019-11-26 NOTE — Addendum Note (Signed)
Addended by: Margurite Auerbach B on: 11/26/2019 01:38 PM   Modules accepted: Orders

## 2019-11-27 LAB — CBC WITH DIFFERENTIAL/PLATELET
Basophils Absolute: 0.1 10*3/uL (ref 0.0–0.2)
Basos: 2 %
EOS (ABSOLUTE): 0.2 10*3/uL (ref 0.0–0.4)
Eos: 3 %
Hematocrit: 46 % (ref 37.5–51.0)
Hemoglobin: 16.1 g/dL (ref 13.0–17.7)
Immature Grans (Abs): 0.2 10*3/uL — ABNORMAL HIGH (ref 0.0–0.1)
Immature Granulocytes: 2 %
Lymphocytes Absolute: 2.5 10*3/uL (ref 0.7–3.1)
Lymphs: 34 %
MCH: 33.5 pg — ABNORMAL HIGH (ref 26.6–33.0)
MCHC: 35 g/dL (ref 31.5–35.7)
MCV: 96 fL (ref 79–97)
Monocytes Absolute: 0.7 10*3/uL (ref 0.1–0.9)
Monocytes: 9 %
Neutrophils Absolute: 3.7 10*3/uL (ref 1.4–7.0)
Neutrophils: 50 %
Platelets: 212 10*3/uL (ref 150–450)
RBC: 4.8 x10E6/uL (ref 4.14–5.80)
RDW: 12.4 % (ref 11.6–15.4)
WBC: 7.4 10*3/uL (ref 3.4–10.8)

## 2019-11-27 LAB — COMPREHENSIVE METABOLIC PANEL
ALT: 32 IU/L (ref 0–44)
AST: 23 IU/L (ref 0–40)
Albumin/Globulin Ratio: 1.9 (ref 1.2–2.2)
Albumin: 4.7 g/dL (ref 3.8–4.8)
Alkaline Phosphatase: 67 IU/L (ref 39–117)
BUN/Creatinine Ratio: 24 (ref 10–24)
BUN: 24 mg/dL (ref 8–27)
Bilirubin Total: 0.3 mg/dL (ref 0.0–1.2)
CO2: 18 mmol/L — ABNORMAL LOW (ref 20–29)
Calcium: 9.8 mg/dL (ref 8.6–10.2)
Chloride: 103 mmol/L (ref 96–106)
Creatinine, Ser: 1 mg/dL (ref 0.76–1.27)
GFR calc Af Amer: 93 mL/min/{1.73_m2} (ref >59)
GFR calc non Af Amer: 80 mL/min/{1.73_m2} (ref >59)
Globulin, Total: 2.5 g/dL (ref 1.5–4.5)
Glucose: 190 mg/dL — ABNORMAL HIGH (ref 65–99)
Potassium: 4.5 mmol/L (ref 3.5–5.2)
Sodium: 139 mmol/L (ref 134–144)
Total Protein: 7.2 g/dL (ref 6.0–8.5)

## 2019-11-27 LAB — MICROALBUMIN / CREATININE URINE RATIO
Creatinine, Urine: 174.9 mg/dL
Microalb/Creat Ratio: 40 mg/g creat — ABNORMAL HIGH (ref 0–29)
Microalbumin, Urine: 70.2 ug/mL

## 2019-11-27 LAB — TSH: TSH: 1.96 u[IU]/mL (ref 0.450–4.500)

## 2019-11-27 LAB — URINALYSIS, ROUTINE W REFLEX MICROSCOPIC
Bilirubin, UA: NEGATIVE
Glucose, UA: NEGATIVE
Ketones, UA: NEGATIVE
Leukocytes,UA: NEGATIVE
Nitrite, UA: NEGATIVE
RBC, UA: NEGATIVE
Specific Gravity, UA: 1.024 (ref 1.005–1.030)
Urobilinogen, Ur: 0.2 mg/dL (ref 0.2–1.0)
pH, UA: 5 (ref 5.0–7.5)

## 2019-11-27 LAB — LIPID PANEL
Chol/HDL Ratio: 4.5 ratio (ref 0.0–5.0)
Cholesterol, Total: 183 mg/dL (ref 100–199)
HDL: 41 mg/dL (ref >39)
LDL Chol Calc (NIH): 94 mg/dL (ref 0–99)
Triglycerides: 286 mg/dL — ABNORMAL HIGH (ref 0–149)
VLDL Cholesterol Cal: 48 mg/dL — ABNORMAL HIGH (ref 5–40)

## 2019-11-27 LAB — PSA: Prostate Specific Ag, Serum: 0.4 ng/mL (ref 0.0–4.0)

## 2019-11-27 LAB — HGB A1C W/O EAG: Hgb A1c MFr Bld: 8.1 % — ABNORMAL HIGH (ref 4.8–5.6)

## 2020-02-25 ENCOUNTER — Other Ambulatory Visit: Payer: Managed Care, Other (non HMO)

## 2020-02-25 ENCOUNTER — Other Ambulatory Visit: Payer: Self-pay

## 2020-02-25 DIAGNOSIS — IMO0002 Reserved for concepts with insufficient information to code with codable children: Secondary | ICD-10-CM

## 2020-02-25 DIAGNOSIS — Z79899 Other long term (current) drug therapy: Secondary | ICD-10-CM

## 2020-02-25 DIAGNOSIS — E7800 Pure hypercholesterolemia, unspecified: Secondary | ICD-10-CM

## 2020-02-25 DIAGNOSIS — E1129 Type 2 diabetes mellitus with other diabetic kidney complication: Secondary | ICD-10-CM

## 2020-02-26 LAB — URINALYSIS, ROUTINE W REFLEX MICROSCOPIC
Bilirubin, UA: NEGATIVE
Glucose, UA: NEGATIVE
Ketones, UA: NEGATIVE
Leukocytes,UA: NEGATIVE
Nitrite, UA: NEGATIVE
Protein,UA: NEGATIVE
RBC, UA: NEGATIVE
Specific Gravity, UA: 1.018 (ref 1.005–1.030)
Urobilinogen, Ur: 0.2 mg/dL (ref 0.2–1.0)
pH, UA: 6.5 (ref 5.0–7.5)

## 2020-02-26 LAB — COMPREHENSIVE METABOLIC PANEL
ALT: 32 IU/L (ref 0–44)
AST: 27 IU/L (ref 0–40)
Albumin/Globulin Ratio: 1.5 (ref 1.2–2.2)
Albumin: 4.5 g/dL (ref 3.8–4.8)
Alkaline Phosphatase: 65 IU/L (ref 39–117)
BUN/Creatinine Ratio: 18 (ref 10–24)
BUN: 20 mg/dL (ref 8–27)
Bilirubin Total: 0.3 mg/dL (ref 0.0–1.2)
CO2: 23 mmol/L (ref 20–29)
Calcium: 9.7 mg/dL (ref 8.6–10.2)
Chloride: 100 mmol/L (ref 96–106)
Creatinine, Ser: 1.13 mg/dL (ref 0.76–1.27)
GFR calc Af Amer: 80 mL/min/{1.73_m2} (ref >59)
GFR calc non Af Amer: 69 mL/min/{1.73_m2} (ref >59)
Globulin, Total: 3 g/dL (ref 1.5–4.5)
Glucose: 167 mg/dL — ABNORMAL HIGH (ref 65–99)
Potassium: 4.6 mmol/L (ref 3.5–5.2)
Sodium: 137 mmol/L (ref 134–144)
Total Protein: 7.5 g/dL (ref 6.0–8.5)

## 2020-02-26 LAB — LIPID PANEL
Chol/HDL Ratio: 4.2 ratio (ref 0.0–5.0)
Cholesterol, Total: 171 mg/dL (ref 100–199)
HDL: 41 mg/dL (ref >39)
LDL Chol Calc (NIH): 85 mg/dL (ref 0–99)
Triglycerides: 269 mg/dL — ABNORMAL HIGH (ref 0–149)
VLDL Cholesterol Cal: 45 mg/dL — ABNORMAL HIGH (ref 5–40)

## 2020-02-26 LAB — HGB A1C W/O EAG: Hgb A1c MFr Bld: 7.6 % — ABNORMAL HIGH (ref 4.8–5.6)

## 2020-06-09 ENCOUNTER — Other Ambulatory Visit: Payer: Self-pay

## 2020-06-09 ENCOUNTER — Other Ambulatory Visit: Payer: Managed Care, Other (non HMO) | Admitting: Nurse Practitioner

## 2020-06-09 DIAGNOSIS — E785 Hyperlipidemia, unspecified: Secondary | ICD-10-CM

## 2020-06-09 DIAGNOSIS — E7800 Pure hypercholesterolemia, unspecified: Secondary | ICD-10-CM

## 2020-06-09 DIAGNOSIS — E1129 Type 2 diabetes mellitus with other diabetic kidney complication: Secondary | ICD-10-CM

## 2020-06-09 DIAGNOSIS — IMO0002 Reserved for concepts with insufficient information to code with codable children: Secondary | ICD-10-CM

## 2020-06-09 DIAGNOSIS — G4730 Sleep apnea, unspecified: Secondary | ICD-10-CM

## 2020-06-09 DIAGNOSIS — I10 Essential (primary) hypertension: Secondary | ICD-10-CM

## 2020-06-10 LAB — COMPREHENSIVE METABOLIC PANEL
ALT: 28 IU/L (ref 0–44)
AST: 21 IU/L (ref 0–40)
Albumin/Globulin Ratio: 1.6 (ref 1.2–2.2)
Albumin: 4.3 g/dL (ref 3.8–4.8)
Alkaline Phosphatase: 69 IU/L (ref 48–121)
BUN/Creatinine Ratio: 19 (ref 10–24)
BUN: 18 mg/dL (ref 8–27)
Bilirubin Total: 0.4 mg/dL (ref 0.0–1.2)
CO2: 22 mmol/L (ref 20–29)
Calcium: 9.6 mg/dL (ref 8.6–10.2)
Chloride: 102 mmol/L (ref 96–106)
Creatinine, Ser: 0.96 mg/dL (ref 0.76–1.27)
GFR calc Af Amer: 97 mL/min/{1.73_m2} (ref >59)
GFR calc non Af Amer: 84 mL/min/{1.73_m2} (ref >59)
Globulin, Total: 2.7 g/dL (ref 1.5–4.5)
Glucose: 162 mg/dL — ABNORMAL HIGH (ref 65–99)
Potassium: 4.4 mmol/L (ref 3.5–5.2)
Sodium: 138 mmol/L (ref 134–144)
Total Protein: 7 g/dL (ref 6.0–8.5)

## 2020-06-10 LAB — CBC WITH DIFFERENTIAL/PLATELET
Basophils Absolute: 0.1 10*3/uL (ref 0.0–0.2)
Basos: 1 %
EOS (ABSOLUTE): 0.2 10*3/uL (ref 0.0–0.4)
Eos: 3 %
Hematocrit: 46.6 % (ref 37.5–51.0)
Hemoglobin: 15.2 g/dL (ref 13.0–17.7)
Immature Grans (Abs): 0.1 10*3/uL (ref 0.0–0.1)
Immature Granulocytes: 2 %
Lymphocytes Absolute: 2.5 10*3/uL (ref 0.7–3.1)
Lymphs: 37 %
MCH: 31 pg (ref 26.6–33.0)
MCHC: 32.6 g/dL (ref 31.5–35.7)
MCV: 95 fL (ref 79–97)
Monocytes Absolute: 0.6 10*3/uL (ref 0.1–0.9)
Monocytes: 9 %
Neutrophils Absolute: 3.3 10*3/uL (ref 1.4–7.0)
Neutrophils: 48 %
Platelets: 200 10*3/uL (ref 150–450)
RBC: 4.91 x10E6/uL (ref 4.14–5.80)
RDW: 12.2 % (ref 11.6–15.4)
WBC: 6.8 10*3/uL (ref 3.4–10.8)

## 2020-06-10 LAB — LIPID PANEL
Chol/HDL Ratio: 4.5 ratio (ref 0.0–5.0)
Cholesterol, Total: 170 mg/dL (ref 100–199)
HDL: 38 mg/dL — ABNORMAL LOW (ref >39)
LDL Chol Calc (NIH): 88 mg/dL (ref 0–99)
Triglycerides: 264 mg/dL — ABNORMAL HIGH (ref 0–149)
VLDL Cholesterol Cal: 44 mg/dL — ABNORMAL HIGH (ref 5–40)

## 2020-06-10 LAB — URINALYSIS, ROUTINE W REFLEX MICROSCOPIC
Bilirubin, UA: NEGATIVE
Glucose, UA: NEGATIVE
Ketones, UA: NEGATIVE
Leukocytes,UA: NEGATIVE
Nitrite, UA: NEGATIVE
RBC, UA: NEGATIVE
Specific Gravity, UA: 1.022 (ref 1.005–1.030)
Urobilinogen, Ur: 0.2 mg/dL (ref 0.2–1.0)
pH, UA: 6 (ref 5.0–7.5)

## 2020-06-10 LAB — HGB A1C W/O EAG: Hgb A1c MFr Bld: 7.5 % — ABNORMAL HIGH (ref 4.8–5.6)

## 2020-06-10 LAB — TSH: TSH: 1.84 u[IU]/mL (ref 0.450–4.500)

## 2020-07-13 ENCOUNTER — Encounter: Payer: Self-pay | Admitting: Ophthalmology

## 2020-07-14 ENCOUNTER — Encounter: Payer: Self-pay | Admitting: Ophthalmology

## 2020-07-14 ENCOUNTER — Other Ambulatory Visit: Payer: Self-pay

## 2020-07-16 ENCOUNTER — Other Ambulatory Visit: Payer: Self-pay

## 2020-07-16 ENCOUNTER — Other Ambulatory Visit
Admission: RE | Admit: 2020-07-16 | Discharge: 2020-07-16 | Disposition: A | Discharge status: Home / Self Care | Payer: Managed Care, Other (non HMO) | Source: Ambulatory Visit | Attending: Ophthalmology | Admitting: Ophthalmology

## 2020-07-16 DIAGNOSIS — Z01812 Encounter for preprocedural laboratory examination: Secondary | ICD-10-CM | POA: Diagnosis present

## 2020-07-16 DIAGNOSIS — Z20822 Contact with and (suspected) exposure to covid-19: Secondary | ICD-10-CM | POA: Insufficient documentation

## 2020-07-16 LAB — SARS CORONAVIRUS 2 (TAT 6-24 HRS): SARS Coronavirus 2: NEGATIVE

## 2020-07-16 NOTE — Discharge Instructions (Signed)

## 2020-07-20 ENCOUNTER — Ambulatory Visit: Payer: Managed Care, Other (non HMO) | Admitting: Anesthesiology

## 2020-07-20 ENCOUNTER — Other Ambulatory Visit: Payer: Self-pay

## 2020-07-20 ENCOUNTER — Encounter
Admission: RE | Disposition: A | Discharge status: Home / Self Care | Payer: Self-pay | Source: Home / Self Care | Attending: Ophthalmology

## 2020-07-20 ENCOUNTER — Encounter: Payer: Self-pay | Admitting: Ophthalmology

## 2020-07-20 ENCOUNTER — Ambulatory Visit
Admission: RE | Admit: 2020-07-20 | Discharge: 2020-07-20 | Disposition: A | Discharge status: Home / Self Care | Payer: Managed Care, Other (non HMO) | Attending: Ophthalmology | Admitting: Ophthalmology

## 2020-07-20 DIAGNOSIS — F329 Major depressive disorder, single episode, unspecified: Secondary | ICD-10-CM | POA: Insufficient documentation

## 2020-07-20 DIAGNOSIS — Z881 Allergy status to other antibiotic agents status: Secondary | ICD-10-CM | POA: Diagnosis not present

## 2020-07-20 DIAGNOSIS — Z85038 Personal history of other malignant neoplasm of large intestine: Secondary | ICD-10-CM | POA: Insufficient documentation

## 2020-07-20 DIAGNOSIS — R519 Headache, unspecified: Secondary | ICD-10-CM | POA: Diagnosis not present

## 2020-07-20 DIAGNOSIS — Z885 Allergy status to narcotic agent status: Secondary | ICD-10-CM | POA: Insufficient documentation

## 2020-07-20 DIAGNOSIS — Z79899 Other long term (current) drug therapy: Secondary | ICD-10-CM | POA: Diagnosis not present

## 2020-07-20 DIAGNOSIS — M1990 Unspecified osteoarthritis, unspecified site: Secondary | ICD-10-CM | POA: Diagnosis not present

## 2020-07-20 DIAGNOSIS — G4730 Sleep apnea, unspecified: Secondary | ICD-10-CM | POA: Diagnosis not present

## 2020-07-20 DIAGNOSIS — F17200 Nicotine dependence, unspecified, uncomplicated: Secondary | ICD-10-CM | POA: Diagnosis not present

## 2020-07-20 DIAGNOSIS — E1136 Type 2 diabetes mellitus with diabetic cataract: Secondary | ICD-10-CM | POA: Diagnosis not present

## 2020-07-20 DIAGNOSIS — Z87442 Personal history of urinary calculi: Secondary | ICD-10-CM | POA: Diagnosis not present

## 2020-07-20 DIAGNOSIS — Z7984 Long term (current) use of oral hypoglycemic drugs: Secondary | ICD-10-CM | POA: Diagnosis not present

## 2020-07-20 DIAGNOSIS — F17210 Nicotine dependence, cigarettes, uncomplicated: Secondary | ICD-10-CM | POA: Diagnosis not present

## 2020-07-20 DIAGNOSIS — Z884 Allergy status to anesthetic agent status: Secondary | ICD-10-CM | POA: Diagnosis not present

## 2020-07-20 DIAGNOSIS — Z85828 Personal history of other malignant neoplasm of skin: Secondary | ICD-10-CM | POA: Insufficient documentation

## 2020-07-20 DIAGNOSIS — E785 Hyperlipidemia, unspecified: Secondary | ICD-10-CM | POA: Diagnosis not present

## 2020-07-20 DIAGNOSIS — I10 Essential (primary) hypertension: Secondary | ICD-10-CM | POA: Diagnosis not present

## 2020-07-20 DIAGNOSIS — H2512 Age-related nuclear cataract, left eye: Secondary | ICD-10-CM | POA: Diagnosis present

## 2020-07-20 DIAGNOSIS — Z9049 Acquired absence of other specified parts of digestive tract: Secondary | ICD-10-CM | POA: Diagnosis not present

## 2020-07-20 DIAGNOSIS — K219 Gastro-esophageal reflux disease without esophagitis: Secondary | ICD-10-CM | POA: Diagnosis not present

## 2020-07-20 HISTORY — DX: Unspecified hearing loss, unspecified ear: H91.90

## 2020-07-20 HISTORY — DX: Gastro-esophageal reflux disease without esophagitis: K21.9

## 2020-07-20 HISTORY — DX: Depression, unspecified: F32.A

## 2020-07-20 HISTORY — DX: Unspecified osteoarthritis, unspecified site: M19.90

## 2020-07-20 HISTORY — DX: Unspecified disorder of nose and nasal sinuses: J34.9

## 2020-07-20 HISTORY — PX: CATARACT EXTRACTION W/PHACO: SHX586

## 2020-07-20 HISTORY — DX: Spondylosis without myelopathy or radiculopathy, cervical region: M47.812

## 2020-07-20 HISTORY — DX: Spondylosis without myelopathy or radiculopathy, cervical region: M47.816

## 2020-07-20 LAB — GLUCOSE, CAPILLARY
Glucose-Capillary: 166 mg/dL — ABNORMAL HIGH (ref 70–99)
Glucose-Capillary: 167 mg/dL — ABNORMAL HIGH (ref 70–99)

## 2020-07-20 SURGERY — PHACOEMULSIFICATION, CATARACT, WITH IOL INSERTION
Anesthesia: Monitor Anesthesia Care | Site: Eye | Laterality: Left

## 2020-07-20 MED ORDER — ARMC OPHTHALMIC DILATING DROPS
1.0000 "application " | OPHTHALMIC | Status: DC | PRN
  Administered 2020-07-20 (×3): 1 via OPHTHALMIC

## 2020-07-20 MED ORDER — NA CHONDROIT SULF-NA HYALURON 40-17 MG/ML IO SOLN
INTRAOCULAR | Status: DC | PRN
  Administered 2020-07-20: 1 mL via INTRAOCULAR

## 2020-07-20 MED ORDER — EPINEPHRINE PF 1 MG/ML IJ SOLN
INTRAOCULAR | Status: DC | PRN
  Administered 2020-07-20: 49 mL via OPHTHALMIC

## 2020-07-20 MED ORDER — FENTANYL CITRATE (PF) 100 MCG/2ML IJ SOLN
INTRAMUSCULAR | Status: DC | PRN
Start: 2020-07-20 — End: 2020-07-20
  Administered 2020-07-20: 50 ug via INTRAVENOUS

## 2020-07-20 MED ORDER — TETRACAINE HCL 0.5 % OP SOLN
1.0000 [drp] | OPHTHALMIC | Status: DC | PRN
  Administered 2020-07-20 (×3): 1 [drp] via OPHTHALMIC

## 2020-07-20 MED ORDER — LACTATED RINGERS IV SOLN: INTRAVENOUS | Status: DC

## 2020-07-20 MED ORDER — BRIMONIDINE TARTRATE-TIMOLOL 0.2-0.5 % OP SOLN
OPHTHALMIC | Status: DC | PRN
  Administered 2020-07-20: 1 [drp] via OPHTHALMIC

## 2020-07-20 MED ORDER — LIDOCAINE HCL (PF) 2 % IJ SOLN
INTRAOCULAR | Status: DC | PRN
  Administered 2020-07-20: 1 mL

## 2020-07-20 MED ORDER — MIDAZOLAM HCL 2 MG/2ML IJ SOLN
INTRAMUSCULAR | Status: DC | PRN
  Administered 2020-07-20: 2 mg via INTRAVENOUS

## 2020-07-20 MED ORDER — MOXIFLOXACIN HCL 0.5 % OP SOLN
OPHTHALMIC | Status: DC | PRN
  Administered 2020-07-20: 0.2 mL via OPHTHALMIC

## 2020-07-20 SURGICAL SUPPLY — 23 items
CANNULA ANT/CHMB 27G (MISCELLANEOUS) ×2 IMPLANT
CANNULA ANT/CHMB 27GA (MISCELLANEOUS) ×6 IMPLANT
GLOVE SURG LX 8.0 MICRO (GLOVE) ×4
GLOVE SURG LX STRL 8.0 MICRO (GLOVE) ×1 IMPLANT
GLOVE SURG TRIUMPH 8.0 PF LTX (GLOVE) ×3 IMPLANT
GOWN STRL REUS W/ TWL LRG LVL3 (GOWN DISPOSABLE) ×2 IMPLANT
GOWN STRL REUS W/TWL LRG LVL3 (GOWN DISPOSABLE) ×6
LENS IOL EYHANCE TORIC II 19.0 ×3 IMPLANT
LENS IOL EYHANCE TRC 225 19.0 IMPLANT
LENS IOL EYHNC TORIC 225 19.0 ×1 IMPLANT
MARKER SKIN DUAL TIP RULER LAB (MISCELLANEOUS) ×3 IMPLANT
NDL FILTER BLUNT 18X1 1/2 (NEEDLE) ×1 IMPLANT
NEEDLE FILTER BLUNT 18X 1/2SAF (NEEDLE) ×2
NEEDLE FILTER BLUNT 18X1 1/2 (NEEDLE) ×1 IMPLANT
PACK EYE AFTER SURG (MISCELLANEOUS) ×3 IMPLANT
PACK OPTHALMIC (MISCELLANEOUS) ×3 IMPLANT
PACK PORFILIO (MISCELLANEOUS) ×3 IMPLANT
SUT ETHILON 10-0 CS-B-6CS-B-6 (SUTURE)
SUTURE EHLN 10-0 CS-B-6CS-B-6 (SUTURE) IMPLANT
SYR 3ML LL SCALE MARK (SYRINGE) ×3 IMPLANT
SYR TB 1ML LUER SLIP (SYRINGE) ×3 IMPLANT
WATER STERILE IRR 250ML POUR (IV SOLUTION) ×3 IMPLANT
WIPE NON LINTING 3.25X3.25 (MISCELLANEOUS) ×3 IMPLANT

## 2020-07-20 NOTE — Anesthesia Preprocedure Evaluation (Signed)
Anesthesia Evaluation    History of Anesthesia Complications (+) PONV and history of anesthetic complications  Airway Mallampati: III  TM Distance: >3 FB Neck ROM: Full    Dental   Pulmonary sleep apnea , Current Smoker and Patient abstained from smoking.,    Pulmonary exam normal        Cardiovascular hypertension, Normal cardiovascular exam     Neuro/Psych  Headaches, PSYCHIATRIC DISORDERS Depression    GI/Hepatic GERD  ,  Endo/Other  diabetes  Renal/GU Renal InsufficiencyRenal disease     Musculoskeletal  (+) Arthritis ,   Abdominal   Peds  Hematology   Anesthesia Other Findings   Reproductive/Obstetrics                             Anesthesia Physical Anesthesia Plan  ASA: III  Anesthesia Plan: MAC   Post-op Pain Management:    Induction:   PONV Risk Score and Plan:   Airway Management Planned: Nasal Cannula  Additional Equipment:   Intra-op Plan:   Post-operative Plan:   Informed Consent: I have reviewed the patients History and Physical, chart, labs and discussed the procedure including the risks, benefits and alternatives for the proposed anesthesia with the patient or authorized representative who has indicated his/her understanding and acceptance.       Plan Discussed with: CRNA, Anesthesiologist and Surgeon  Anesthesia Plan Comments:         Anesthesia Quick Evaluation

## 2020-07-20 NOTE — Anesthesia Procedure Notes (Signed)
Procedure Name: MAC Performed by: Analycia Khokhar, CRNA Pre-anesthesia Checklist: Patient identified, Emergency Drugs available, Suction available, Timeout performed and Patient being monitored Patient Re-evaluated:Patient Re-evaluated prior to induction Oxygen Delivery Method: Nasal cannula Placement Confirmation: positive ETCO2       

## 2020-07-20 NOTE — H&P (Signed)
[facility]   Primary Care Physician:  Adin Hector, MD Ophthalmologist: Dr. George Ina  Pre-Procedure History & Physical: HPI:  Stuart Rasmussen is a 64 y.o. male here for cataract surgery.   Past Medical History:  Diagnosis Date  . Arthritis    knees, wrist  . Cancer (HCC)    HX of colon and BCC arm and nose  . Complication of anesthesia   . Degenerative joint disease of cervical and lumbar spine   . Depression   . Diabetes mellitus without complication ([place])    type 2  . GERD (gastroesophageal reflux disease)   . Headache    migraines occassionally  . HOH (hard of hearing)    mild  . HOH (hard of hearing)   . Hyperlipidemia   . Hypertension   . MGUS (monoclonal gammopathy of unknown significance)   . PONV (postoperative nausea and vomiting)   . Proteinuria   . Sinus trouble    allergies  . Sleep apnea    CPAP    Past Surgical History:  Procedure Laterality Date  . ANKLE ARTHROSCOPY    . COLON SURGERY  01/2011   partial resection  . COLONOSCOPY WITH PROPOFOL N/A 05/14/2015   Procedure: COLONOSCOPY WITH PROPOFOL;  Surgeon: Hulen Luster, MD;  Location: [facility] ENDOSCOPY;  Service: Gastroenterology;  Laterality: N/A;  . EXTRACORPOREAL SHOCK WAVE LITHOTRIPSY Right 05/24/2017   Procedure: EXTRACORPOREAL SHOCK WAVE LITHOTRIPSY (ESWL);  Surgeon: Royston Cowper, MD;  Location: [facility] ORS;  Service: Urology;  Laterality: Right;  . HERNIA REPAIR     inguinal  . HYDROCELE EXCISION Left 08/31/2015   Procedure: HYDROCELECTOMY ADULT;  Surgeon: Royston Cowper, MD;  Location: [facility] ORS;  Service: Urology;  Laterality: Left;  . PLANTAR FASCIECTOMY    . SINUSOTOMY  01/2010  . TONSILLECTOMY      Prior to Admission medications   Medication Sig Start Date End Date Taking? Authorizing Provider  atorvastatin (LIPITOR) 10 MG tablet Take 10 mg by mouth daily.   Yes [provider]  buPROPion (WELLBUTRIN XL) 150 MG 24 hr tablet Take 150 mg by mouth daily.   Yes [provider]  busPIRone (BUSPAR) 15 MG tablet buspirone 15 mg tablet 06/04/17  Yes [provider]  DULoxetine (CYMBALTA) 30 MG capsule Take 30 mg by mouth daily.   Yes [provider]  glipiZIDE (GLUCOTROL XL) 10 MG 24 hr tablet Take by mouth. 04/15/18 07/20/20 Yes [provider]  HYDROcodone-acetaminophen (NORCO) 10-325 MG per tablet Take 1 tablet by mouth every 6 (six) hours as needed.   Yes [provider]  ibuprofen (ADVIL) 200 MG tablet Take by mouth every 6 (six) hours as needed.    Yes [provider]  loratadine (CLARITIN) 10 MG tablet Take 10 mg by mouth daily.   Yes [provider]  losartan (COZAAR) 50 MG tablet Take 50 mg by mouth daily.   Yes [provider]  metFORMIN (GLUCOPHAGE) 500 MG tablet Take by mouth 2 (two) times daily with a meal.   Yes [provider]  metoprolol succinate (TOPROL-XL) 25 MG 24 hr tablet metoprolol succinate ER 25 mg tablet,extended release 24 hr 11/28/17  Yes [provider]  metoprolol tartrate (LOPRESSOR) 25 MG tablet Take 25 mg by mouth 2 (two) times daily.   Yes [provider]  metroNIDAZOLE (METROGEL) 1 % gel as needed.    Yes [provider]  Multiple Vitamins-Minerals (MULTIVITAMIN ADULT EXTRA C PO) Take  by mouth.   Yes [provider]  NUCYNTA 50 MG TABS tablet Take 1 tablet (50 mg total) by mouth every 6 (six) hours as needed. 1 TO 2 TABS Q 6 HOURS PRN PAIN 08/31/15  Yes Royston Cowper, MD  telmisartan (MICARDIS) 20 MG tablet Take 20 mg by mouth daily.   Yes [provider]  Cholecalciferol (VITAMIN D3) 1000 UNITS CAPS Take by mouth.    [provider]  ciprofloxacin (CIPRO) 500 MG tablet Take 1 tablet (500 mg total) by mouth 2 (two) times daily. Patient not taking: Reported on 07/14/2020 05/24/17   Royston Cowper, MD  docusate sodium (COLACE) 100 MG capsule Take 2 capsules (200 mg total) by mouth 2 (two) times  daily. Patient not taking: Reported on 07/14/2020 08/31/15   Royston Cowper, MD  levofloxacin [facility]) 750 MG tablet levofloxacin 750 mg tablet Patient not taking: Reported on 07/14/2020    [provider]  NUCYNTA 50 MG tablet Take 1 tablet (50 mg total) by mouth every 6 (six) hours as needed for moderate pain. 1 TO 2 TABS Q 6 HOURS PRN PAIN Patient not taking: Reported on 07/14/2020 05/24/17   Royston Cowper, MD  ondansetron [facility] ODT) 8 MG disintegrating tablet Take 0.5 tablets (4 mg total) by mouth every 6 (six) hours as needed for nausea or vomiting. Patient not taking: Reported on 07/14/2020 05/24/17   Royston Cowper, MD  ranitidine (ZANTAC) 150 MG tablet Take 150 mg by mouth daily. Patient not taking: Reported on 07/14/2020    [provider]  scopolamine (TRANSDERM-SCOP) 1 MG/3DAYS Place 1 patch (1.5 mg total) onto the skin once. Patient not taking: Reported on 05/24/2017 08/31/15   Royston Cowper, MD    Allergies as of 06/17/2020 - Review Complete 10/28/2018  Allergen Reaction Noted  . Morphine Nausea Only 05/10/2014  . Morphine and related Nausea Only 05/13/2015  . Tetracycline Swelling 05/10/2014  . Tetracyclines & related Swelling 05/13/2015    History reviewed. No pertinent family history.  Social History   Socioeconomic History  . Marital status: Divorced    Spouse name: Not on file  . Number of children: Not on file  . Years of education: Not on file  . Highest education level: Not on file  Occupational History  . Not on file  Tobacco Use  . Smoking status: Light Tobacco Smoker    Packs/day: 0.25    Years: 20.00    Pack years: 5.00    Types: Cigarettes  . Smokeless tobacco: Current User    Types: Chew  Substance and Sexual Activity  . Alcohol use: Not Currently    Comment: occasional  . Drug use: No  . Sexual activity: Not on file  Other Topics Concern  . Not on file  Social History Narrative  . Not on file   Social Determinants of  Health   Financial Resource Strain:   . Difficulty of Paying Living Expenses: Not on file  Food Insecurity:   . Worried About Charity fundraiser in the Last Year: Not on file  . Ran Out of Food in the Last Year: Not on file  Transportation Needs:   . Lack of Transportation (Medical): Not on file  . Lack of Transportation (Non-Medical): Not on file  Physical Activity:   . Days of Exercise per Week: Not on file  . Minutes of Exercise per Session: Not on file  Stress:   . Feeling of Stress : Not  on file  Social Connections:   . Frequency of Communication with Friends and Family: Not on file  . Frequency of Social Gatherings with Friends and Family: Not on file  . Attends Religious Services: Not on file  . Active Member of Clubs or Organizations: Not on file  . Attends Archivist Meetings: Not on file  . Marital Status: Not on file  Intimate Partner Violence:   . Fear of Current or Ex-Partner: Not on file  . Emotionally Abused: Not on file  . Physically Abused: Not on file  . Sexually Abused: Not on file    Review of Systems: See HPI, otherwise negative ROS  Physical Exam: BP (!) 146/92   Pulse 84   Temp (!) 97.5 F (36.4 C) (Temporal)   Ht 6\' 3"  (1.905 m)   Wt (!) 141.5 kg   SpO2 99%   BMI 39.00 kg/m  General:   Alert,  pleasant and cooperative in NAD Head:  Normocephalic and atraumatic. Lungs:  Clear to auscultation.    Heart:  Regular rate and rhythm.   Impression/Plan: JACY HOWAT is here for cataract surgery.  Risks, benefits, limitations, and alternatives regarding cataract surgery have been reviewed with the patient.  Questions have been answered.  All parties agreeable.   Birder Robson, MD  07/20/2020, 10:46 AM

## 2020-07-20 NOTE — Transfer of Care (Signed)
Immediate Anesthesia Transfer of Care Note  Patient: Stuart Rasmussen  Procedure(s) Performed: CATARACT EXTRACTION PHACO AND INTRAOCULAR LENS PLACEMENT (IOC) LEFT DIABETIC TORIC LENS (Left Eye)  Patient Location: PACU  Anesthesia Type: MAC  Level of Consciousness: awake, alert  and patient cooperative  Airway and Oxygen Therapy: Patient Spontanous Breathing and Patient connected to supplemental oxygen  Post-op Assessment: Post-op Vital signs reviewed, Patient's Cardiovascular Status Stable, Respiratory Function Stable, Patent Airway and No signs of Nausea or vomiting  Post-op Vital Signs: Reviewed and stable  Complications: No complications documented.

## 2020-07-20 NOTE — Op Note (Signed)
PREOPERATIVE DIAGNOSIS:  Nuclear sclerotic cataract of the left eye.   POSTOPERATIVE DIAGNOSIS:  Nuclear sclerotic cataract of the left eye.   OPERATIVE PROCEDURE: Procedure(s): CATARACT EXTRACTION PHACO AND INTRAOCULAR LENS PLACEMENT ([facility]) LEFT DIABETIC TORIC LENS   SURGEON:  Birder Robson, MD.   ANESTHESIA: 1.      Managed anesthesia care. 2.     0.43ml os Shugarcaine was instilled following the paracentesis [email or web address]@   COMPLICATIONS:  None.   TECHNIQUE:   Stop and chop    DESCRIPTION OF PROCEDURE:  The patient was examined and consented in the preoperative holding area where the aforementioned topical anesthesia was applied to the left eye.  The patient was brought back to the Operating Room where he was sat upright on the gurney and given a target to fixate upon while the eye was marked at the 3:00 and 9:00 position.  The patient was then reclined on the operating table.  The eye was prepped and draped in the usual sterile ophthalmic fashion and a lid speculum was placed. A paracentesis was created with the side port blade and the anterior chamber was filled with viscoelastic. A near clear corneal incision was performed with the steel keratome. A continuous curvilinear capsulorrhexis was performed with a cystotome followed by the capsulorrhexis forceps. Hydrodissection and hydrodelineation were carried out with BSS on a blunt cannula. The lens was removed in a stop and chop technique and the remaining cortical material was removed with the irrigation-aspiration handpiece. The eye was inflated with viscoelastic and the DIU  lens was placed in the eye and rotated to within a few degrees of the predetermined orientation.  The remaining viscoelastic was removed from the eye.  The Sinskey hook was used to rotate the toric lens into its final resting place at 030 degrees.  0.1 ml of Vigamox was placed in the anterior chamber. The eye was inflated to a physiologic pressure and found to be  watertight.  The eye was dressed with Vigamox. The patient was given protective glasses to wear throughout the day and a shield with which to sleep tonight. The patient was also given drops with which to begin a drop regimen today and will follow-up with me in one day. Implant Name Type Inv. Item Serial No. Manufacturer Lot No. LRB No. Used Action  LENS II EYHANCE 19.0 - [id number]  LENS II EYHANCE 19.0 [id number] JOHNSON   Left 1 Implanted   Procedure(s) with comments: CATARACT EXTRACTION PHACO AND INTRAOCULAR LENS PLACEMENT ([facility]) LEFT DIABETIC TORIC LENS (Left) - 9.10 0:45.4  Electronically signed: Birder Robson 9/28/202111:15 AM

## 2020-07-20 NOTE — Anesthesia Postprocedure Evaluation (Signed)
Anesthesia Post Note  Patient: TULLIO CHAUSSE  Procedure(s) Performed: CATARACT EXTRACTION PHACO AND INTRAOCULAR LENS PLACEMENT (IOC) LEFT DIABETIC TORIC LENS (Left Eye)     Patient location during evaluation: PACU Anesthesia Type: MAC Level of consciousness: awake and alert Pain management: pain level controlled Vital Signs Assessment: post-procedure vital signs reviewed and stable Respiratory status: spontaneous breathing, nonlabored ventilation, respiratory function stable and patient connected to nasal cannula oxygen Cardiovascular status: stable and blood pressure returned to baseline Postop Assessment: no apparent nausea or vomiting Anesthetic complications: no   No complications documented.  Wanda Plump Zakiya Sporrer

## 2020-07-21 ENCOUNTER — Encounter: Payer: Self-pay | Admitting: Ophthalmology

## 2020-08-10 ENCOUNTER — Other Ambulatory Visit: Payer: Self-pay

## 2020-08-10 ENCOUNTER — Encounter: Payer: Self-pay | Admitting: Ophthalmology

## 2020-08-13 ENCOUNTER — Other Ambulatory Visit: Payer: Self-pay

## 2020-08-13 ENCOUNTER — Other Ambulatory Visit
Admission: RE | Admit: 2020-08-13 | Discharge: 2020-08-13 | Disposition: A | Discharge status: Home / Self Care | Payer: Managed Care, Other (non HMO) | Source: Ambulatory Visit | Attending: Ophthalmology | Admitting: Ophthalmology

## 2020-08-13 DIAGNOSIS — Z20822 Contact with and (suspected) exposure to covid-19: Secondary | ICD-10-CM | POA: Diagnosis not present

## 2020-08-13 DIAGNOSIS — Z01812 Encounter for preprocedural laboratory examination: Secondary | ICD-10-CM | POA: Diagnosis present

## 2020-08-13 NOTE — Discharge Instructions (Signed)

## 2020-08-13 NOTE — Anesthesia Preprocedure Evaluation (Addendum)
Anesthesia Evaluation  Patient identified by MRN, date of birth, ID band Patient awake    Reviewed: Allergy & Precautions, NPO status , Patient's Chart, lab work & pertinent test results, reviewed documented beta blocker date and time   History of Anesthesia Complications (+) PONV and history of anesthetic complications  Airway Mallampati: III  TM Distance: >3 FB Neck ROM: Full    Dental   Pulmonary sleep apnea , Current Smoker and Patient abstained from smoking.,    Pulmonary exam normal breath sounds clear to auscultation       Cardiovascular hypertension, (-) angina(-) DOE Normal cardiovascular exam Rhythm:Regular Rate:Normal   HLD   Neuro/Psych  Headaches, PSYCHIATRIC DISORDERS Anxiety Depression    GI/Hepatic GERD  ,  Endo/Other  diabetes  Renal/GU Renal InsufficiencyRenal disease     Musculoskeletal  (+) Arthritis ,   Abdominal (+) + obese (BMI 40),   Peds  Hematology  (+) Blood dyscrasia (MGUS), ,   Anesthesia Other Findings Colon cancer Skin cancer  Reproductive/Obstetrics                            Anesthesia Physical  Anesthesia Plan  ASA: III  Anesthesia Plan: MAC   Post-op Pain Management:    Induction: Intravenous  PONV Risk Score and Plan: 1 and TIVA, Midazolam, Treatment may vary due to age or medical condition and Ondansetron  Airway Management Planned: Nasal Cannula  Additional Equipment:   Intra-op Plan:   Post-operative Plan:   Informed Consent: I have reviewed the patients History and Physical, chart, labs and discussed the procedure including the risks, benefits and alternatives for the proposed anesthesia with the patient or authorized representative who has indicated his/her understanding and acceptance.       Plan Discussed with: CRNA, Anesthesiologist and Surgeon  Anesthesia Plan Comments:         Anesthesia Quick Evaluation

## 2020-08-14 LAB — SARS CORONAVIRUS 2 (TAT 6-24 HRS): SARS Coronavirus 2: NEGATIVE

## 2020-08-17 ENCOUNTER — Encounter
Admission: RE | Disposition: A | Discharge status: Home / Self Care | Payer: Self-pay | Source: Home / Self Care | Attending: Ophthalmology

## 2020-08-17 ENCOUNTER — Encounter: Payer: Self-pay | Admitting: Ophthalmology

## 2020-08-17 ENCOUNTER — Other Ambulatory Visit: Payer: Self-pay

## 2020-08-17 ENCOUNTER — Ambulatory Visit
Admission: RE | Admit: 2020-08-17 | Discharge: 2020-08-17 | Disposition: A | Discharge status: Home / Self Care | Payer: Managed Care, Other (non HMO) | Attending: Ophthalmology | Admitting: Ophthalmology

## 2020-08-17 ENCOUNTER — Ambulatory Visit: Payer: Managed Care, Other (non HMO) | Admitting: Anesthesiology

## 2020-08-17 DIAGNOSIS — Z885 Allergy status to narcotic agent status: Secondary | ICD-10-CM | POA: Diagnosis not present

## 2020-08-17 DIAGNOSIS — Z881 Allergy status to other antibiotic agents status: Secondary | ICD-10-CM | POA: Insufficient documentation

## 2020-08-17 DIAGNOSIS — E1136 Type 2 diabetes mellitus with diabetic cataract: Secondary | ICD-10-CM | POA: Insufficient documentation

## 2020-08-17 DIAGNOSIS — H2511 Age-related nuclear cataract, right eye: Secondary | ICD-10-CM | POA: Diagnosis present

## 2020-08-17 DIAGNOSIS — F17210 Nicotine dependence, cigarettes, uncomplicated: Secondary | ICD-10-CM | POA: Insufficient documentation

## 2020-08-17 HISTORY — PX: CATARACT EXTRACTION W/PHACO: SHX586

## 2020-08-17 LAB — GLUCOSE, CAPILLARY
Glucose-Capillary: 160 mg/dL — ABNORMAL HIGH (ref 70–99)
Glucose-Capillary: 158 mg/dL — ABNORMAL HIGH (ref 70–99)

## 2020-08-17 SURGERY — PHACOEMULSIFICATION, CATARACT, WITH IOL INSERTION
Anesthesia: Monitor Anesthesia Care | Site: Eye | Laterality: Right

## 2020-08-17 MED ORDER — MIDAZOLAM HCL 2 MG/2ML IJ SOLN
INTRAMUSCULAR | Status: DC | PRN
  Administered 2020-08-17: 2 mg via INTRAVENOUS

## 2020-08-17 MED ORDER — MOXIFLOXACIN HCL 0.5 % OP SOLN
OPHTHALMIC | Status: DC | PRN
  Administered 2020-08-17: 0.2 mL via OPHTHALMIC

## 2020-08-17 MED ORDER — BRIMONIDINE TARTRATE-TIMOLOL 0.2-0.5 % OP SOLN
OPHTHALMIC | Status: DC | PRN
  Administered 2020-08-17: 1 [drp] via OPHTHALMIC

## 2020-08-17 MED ORDER — FENTANYL CITRATE (PF) 100 MCG/2ML IJ SOLN
INTRAMUSCULAR | Status: DC | PRN
  Administered 2020-08-17 (×2): 50 ug via INTRAVENOUS

## 2020-08-17 MED ORDER — ARMC OPHTHALMIC DILATING DROPS
1.0000 "application " | OPHTHALMIC | Status: DC | PRN
  Administered 2020-08-17 (×3): 1 via OPHTHALMIC

## 2020-08-17 MED ORDER — EPINEPHRINE PF 1 MG/ML IJ SOLN
INTRAOCULAR | Status: DC | PRN
  Administered 2020-08-17: 83 mL via OPHTHALMIC

## 2020-08-17 MED ORDER — TETRACAINE HCL 0.5 % OP SOLN
1.0000 [drp] | OPHTHALMIC | Status: DC | PRN
  Administered 2020-08-17 (×3): 1 [drp] via OPHTHALMIC

## 2020-08-17 MED ORDER — LIDOCAINE HCL (PF) 2 % IJ SOLN
INTRAOCULAR | Status: DC | PRN
  Administered 2020-08-17: 1 mL

## 2020-08-17 MED ORDER — NA CHONDROIT SULF-NA HYALURON 40-17 MG/ML IO SOLN
INTRAOCULAR | Status: DC | PRN
  Administered 2020-08-17: 1 mL via INTRAOCULAR

## 2020-08-17 MED ORDER — LACTATED RINGERS IV SOLN: INTRAVENOUS | Status: DC

## 2020-08-17 SURGICAL SUPPLY — 21 items
CANNULA ANT/CHMB 27G (MISCELLANEOUS) ×2 IMPLANT
CANNULA ANT/CHMB 27GA (MISCELLANEOUS) ×6 IMPLANT
GLOVE SURG LX 8.0 MICRO (GLOVE) ×4
GLOVE SURG LX STRL 8.0 MICRO (GLOVE) ×1 IMPLANT
GLOVE SURG TRIUMPH 8.0 PF LTX (GLOVE) ×3 IMPLANT
GOWN STRL REUS W/ TWL LRG LVL3 (GOWN DISPOSABLE) ×2 IMPLANT
GOWN STRL REUS W/TWL LRG LVL3 (GOWN DISPOSABLE) ×6
LENS IOL TECNIS EYHANCE 20.0 (Intraocular Lens) ×2 IMPLANT
MARKER SKIN DUAL TIP RULER LAB (MISCELLANEOUS) ×3 IMPLANT
NDL FILTER BLUNT 18X1 1/2 (NEEDLE) ×1 IMPLANT
NEEDLE FILTER BLUNT 18X 1/2SAF (NEEDLE) ×2
NEEDLE FILTER BLUNT 18X1 1/2 (NEEDLE) ×1 IMPLANT
PACK EYE AFTER SURG (MISCELLANEOUS) ×3 IMPLANT
PACK OPTHALMIC (MISCELLANEOUS) ×3 IMPLANT
PACK PORFILIO (MISCELLANEOUS) ×3 IMPLANT
SUT ETHILON 10-0 CS-B-6CS-B-6 (SUTURE)
SUTURE EHLN 10-0 CS-B-6CS-B-6 (SUTURE) IMPLANT
SYR 3ML LL SCALE MARK (SYRINGE) ×3 IMPLANT
SYR TB 1ML LUER SLIP (SYRINGE) ×3 IMPLANT
WATER STERILE IRR 250ML POUR (IV SOLUTION) ×3 IMPLANT
WIPE NON LINTING 3.25X3.25 (MISCELLANEOUS) ×3 IMPLANT

## 2020-08-17 NOTE — Transfer of Care (Signed)
Immediate Anesthesia Transfer of Care Note  Patient: Stuart Rasmussen  Procedure(s) Performed: CATARACT EXTRACTION PHACO AND INTRAOCULAR LENS PLACEMENT (IOC) RIGHT TORIC LENS (Right Eye)  Patient Location: PACU  Anesthesia Type: MAC  Level of Consciousness: awake, alert  and patient cooperative  Airway and Oxygen Therapy: Patient Spontanous Breathing and Patient connected to supplemental oxygen  Post-op Assessment: Post-op Vital signs reviewed, Patient's Cardiovascular Status Stable, Respiratory Function Stable, Patent Airway and No signs of Nausea or vomiting  Post-op Vital Signs: Reviewed and stable  Complications: No complications documented.

## 2020-08-17 NOTE — Anesthesia Procedure Notes (Signed)
Procedure Name: MAC Date/Time: 08/17/2020 10:37 AM Performed by: Jeannene Patella, CRNA Pre-anesthesia Checklist: Patient identified, Emergency Drugs available, Suction available, Timeout performed and Patient being monitored Patient Re-evaluated:Patient Re-evaluated prior to induction Oxygen Delivery Method: Nasal cannula Placement Confirmation: positive ETCO2

## 2020-08-17 NOTE — Anesthesia Postprocedure Evaluation (Signed)
Anesthesia Post Note  Patient: Stuart Rasmussen  Procedure(s) Performed: CATARACT EXTRACTION PHACO AND INTRAOCULAR LENS PLACEMENT (I[facility]) RIGHT TORIC LENS (Right Eye)     Patient location during evaluation: PACU Anesthesia Type: MAC Level of consciousness: awake and alert Pain management: pain level controlled Vital Signs Assessment: post-procedure vital signs reviewed and stable Respiratory status: spontaneous breathing, nonlabored ventilation, respiratory function stable and patient connected to nasal cannula oxygen Cardiovascular status: stable and blood pressure returned to baseline Postop Assessment: no apparent nausea or vomiting Anesthetic complications: no   No complications documented.  Shuna Tabor A  Cathie Bonnell

## 2020-08-17 NOTE — Op Note (Signed)
PREOPERATIVE DIAGNOSIS:  Nuclear sclerotic cataract of the right eye.   POSTOPERATIVE DIAGNOSIS:   Cataract   OPERATIVE PROCEDURE:@   SURGEON:  Birder Robson, MD.   ANESTHESIA:  Anesthesiologist: Heniser, Fredric Dine, MD CRNA: Jeannene Patella, CRNA  1.      Managed anesthesia care. 2.      0.30ml of Shugarcaine was instilled in the eye following the paracentesis.   COMPLICATIONS:  None.   TECHNIQUE:   Stop and chop   DESCRIPTION OF PROCEDURE:  The patient was examined and consented in the preoperative holding area where the aforementioned topical anesthesia was applied to the right eye and then brought back to the Operating Room where the right eye was prepped and draped in the usual sterile ophthalmic fashion and a lid speculum was placed. A paracentesis was created with the side port blade and the anterior chamber was filled with viscoelastic. A near clear corneal incision was performed with the steel keratome. A continuous curvilinear capsulorrhexis was performed with a cystotome followed by the capsulorrhexis forceps. Hydrodissection and hydrodelineation were carried out with BSS on a blunt cannula. The lens was removed in a stop and chop  technique and the remaining cortical material was removed with the irrigation-aspiration handpiece. The capsular bag was inflated with viscoelastic and the Technis [id number]  lens was placed in the capsular bag without complication. The remaining viscoelastic was removed from the eye with the irrigation-aspiration handpiece. The wounds were hydrated. The anterior chamber was flushed with BSS and the eye was inflated to physiologic pressure. 0.38ml of Vigamox was placed in the anterior chamber. The wounds were found to be water tight. The eye was dressed with Combigan. The patient was given protective glasses to wear throughout the day and a shield with which to sleep tonight. The patient was also given drops with which to begin a drop regimen today and will  follow-up with me in one day. Implant Name Type Inv. Item Serial No. Manufacturer Lot No. LRB No. Used Action  LENS IOL TECNIS EYHANCE 20.0 - [id number] Intraocular Lens LENS IOL TECNIS EYHANCE 20.0 [id number] JOHNSON   Right 1 Implanted   Procedure(s) with comments: CATARACT EXTRACTION PHACO AND INTRAOCULAR LENS PLACEMENT (IOC) RIGHT TORIC LENS (Right) - 5.43 0:40.5 D  Electronically signed: Birder Robson 08/17/2020 10:55 AM

## 2020-08-17 NOTE — H&P (Signed)
[facility]   Primary Care Physician:  Adin Hector, MD Ophthalmologist: Dr. George Ina  Pre-Procedure History & Physical: HPI:  Stuart Rasmussen is a 64 y.o. male here for cataract surgery.   Past Medical History:  Diagnosis Date  . Arthritis    knees, wrist  . Cancer (HCC)    HX of colon and BCC arm and nose  . Complication of anesthesia   . Degenerative joint disease of cervical and lumbar spine   . Depression   . Diabetes mellitus without complication ([place])    type 2  . GERD (gastroesophageal reflux disease)   . Headache    migraines occassionally  . HOH (hard of hearing)    mild  . HOH (hard of hearing)   . Hyperlipidemia   . Hypertension   . MGUS (monoclonal gammopathy of unknown significance)   . PONV (postoperative nausea and vomiting)   . Proteinuria   . Sinus trouble    allergies  . Sleep apnea    CPAP    Past Surgical History:  Procedure Laterality Date  . ANKLE ARTHROSCOPY    . CATARACT EXTRACTION W/PHACO Left 07/20/2020   Procedure: CATARACT EXTRACTION PHACO AND INTRAOCULAR LENS PLACEMENT ([place]) LEFT DIABETIC TORIC LENS;  Surgeon: Birder Robson, MD;  Location: [place];  Service: Ophthalmology;  Laterality: Left;  9.10 0:45.4  . COLON SURGERY  01/2011   partial resection  . COLONOSCOPY WITH PROPOFOL N/A 05/14/2015   Procedure: COLONOSCOPY WITH PROPOFOL;  Surgeon: Hulen Luster, MD;  Location: [facility] ENDOSCOPY;  Service: Gastroenterology;  Laterality: N/A;  . EXTRACORPOREAL SHOCK WAVE LITHOTRIPSY Right 05/24/2017   Procedure: EXTRACORPOREAL SHOCK WAVE LITHOTRIPSY (ESWL);  Surgeon: Royston Cowper, MD;  Location: [facility] ORS;  Service: Urology;  Laterality: Right;  . HERNIA REPAIR     inguinal  . HYDROCELE EXCISION Left 08/31/2015   Procedure: HYDROCELECTOMY ADULT;  Surgeon: Royston Cowper, MD;  Location: [facility] ORS;  Service: Urology;  Laterality: Left;  . PLANTAR FASCIECTOMY    . SINUSOTOMY  01/2010  . TONSILLECTOMY      Prior to  Admission medications   Medication Sig Start Date End Date Taking? Authorizing Provider  atorvastatin (LIPITOR) 10 MG tablet Take 10 mg by mouth daily.   Yes [provider]  buPROPion (WELLBUTRIN XL) 150 MG 24 hr tablet Take 150 mg by mouth daily.   Yes [provider]  busPIRone (BUSPAR) 15 MG tablet buspirone 15 mg tablet 06/04/17  Yes [provider]  Cholecalciferol (VITAMIN D3) 1000 UNITS CAPS Take by mouth.   Yes [provider]  DULoxetine (CYMBALTA) 30 MG capsule Take 30 mg by mouth daily.   Yes [provider]  glipiZIDE (GLUCOTROL XL) 10 MG 24 hr tablet Take by mouth. 04/15/18 08/10/20 Yes [provider]  HYDROcodone-acetaminophen (NORCO) 10-325 MG per tablet Take 1 tablet by mouth every 6 (six) hours as needed.   Yes [provider]  ibuprofen (ADVIL) 200 MG tablet Take by mouth every 6 (six) hours as needed.    Yes [provider]  loratadine (CLARITIN) 10 MG tablet Take 10 mg by mouth daily.   Yes [provider]  losartan (COZAAR) 50 MG tablet Take 50 mg by mouth daily.   Yes [provider]  metFORMIN (GLUCOPHAGE) 500 MG tablet Take 1,000 mg by mouth 2 (two) times daily with a meal.    Yes [provider]  metoprolol succinate (TOPROL-XL) 25 MG 24 hr tablet metoprolol  succinate ER 25 mg tablet,extended release 24 hr 11/28/17  Yes [provider]  metroNIDAZOLE (METROGEL) 1 % gel as needed.    Yes [provider]  Multiple Vitamins-Minerals (MULTIVITAMIN ADULT EXTRA C PO) Take by mouth.   Yes [provider]  telmisartan (MICARDIS) 20 MG tablet Take 20 mg by mouth daily.   Yes [provider]    Allergies as of 07/22/2020 - Review Complete 07/20/2020  Allergen Reaction Noted  . Morphine Nausea Only 05/10/2014  . Morphine and related Nausea Only 05/13/2015  . Tetracycline Swelling 05/10/2014  . Tetracyclines & related Swelling 05/13/2015     History reviewed. No pertinent family history.  Social History   Socioeconomic History  . Marital status: Divorced    Spouse name: Not on file  . Number of children: Not on file  . Years of education: Not on file  . Highest education level: Not on file  Occupational History  . Not on file  Tobacco Use  . Smoking status: Light Tobacco Smoker    Packs/day: 0.25    Years: 20.00    Pack years: 5.00    Types: Cigarettes  . Smokeless tobacco: Current User    Types: Chew  Vaping Use  . Vaping Use: Never used  Substance and Sexual Activity  . Alcohol use: Not Currently    Comment: occasional  . Drug use: No  . Sexual activity: Not on file  Other Topics Concern  . Not on file  Social History Narrative  . Not on file   Social Determinants of Health   Financial Resource Strain:   . Difficulty of Paying Living Expenses: Not on file  Food Insecurity:   . Worried About Charity fundraiser in the Last Year: Not on file  . Ran Out of Food in the Last Year: Not on file  Transportation Needs:   . Lack of Transportation (Medical): Not on file  . Lack of Transportation (Non-Medical): Not on file  Physical Activity:   . Days of Exercise per Week: Not on file  . Minutes of Exercise per Session: Not on file  Stress:   . Feeling of Stress : Not on file  Social Connections:   . Frequency of Communication with Friends and Family: Not on file  . Frequency of Social Gatherings with Friends and Family: Not on file  . Attends Religious Services: Not on file  . Active Member of Clubs or Organizations: Not on file  . Attends Archivist Meetings: Not on file  . Marital Status: Not on file  Intimate Partner Violence:   . Fear of Current or Ex-Partner: Not on file  . Emotionally Abused: Not on file  . Physically Abused: Not on file  . Sexually Abused: Not on file    Review of Systems: See HPI, otherwise negative ROS  Physical Exam: BP (!) 135/91   Pulse 76   Temp (!)  97.3 F (36.3 C) (Temporal)   Resp 16   Ht 6\' 2"  (1.88 m)   Wt (!) 141.6 kg   SpO2 97%   BMI 40.08 kg/m  General:   Alert,  pleasant and cooperative in NAD Head:  Normocephalic and atraumatic. Lungs:  Clear to auscultation.    Heart:  Regular rate and rhythm.   Impression/Plan: Stuart Rasmussen is here for cataract surgery.  Risks, benefits, limitations, and alternatives regarding cataract surgery have been reviewed with the patient.  Questions have been answered.  All parties agreeable.  Birder Robson, MD  08/17/2020, 10:23 AM

## 2020-08-18 ENCOUNTER — Encounter: Payer: Self-pay | Admitting: Ophthalmology

## 2020-10-06 ENCOUNTER — Other Ambulatory Visit: Payer: Self-pay

## 2020-10-06 ENCOUNTER — Other Ambulatory Visit
Admission: RE | Admit: 2020-10-06 | Discharge: 2020-10-06 | Disposition: A | Discharge status: Home / Self Care | Payer: Managed Care, Other (non HMO) | Source: Ambulatory Visit | Attending: General Surgery | Admitting: General Surgery

## 2020-10-06 DIAGNOSIS — Z01812 Encounter for preprocedural laboratory examination: Secondary | ICD-10-CM | POA: Diagnosis not present

## 2020-10-06 DIAGNOSIS — Z20822 Contact with and (suspected) exposure to covid-19: Secondary | ICD-10-CM | POA: Insufficient documentation

## 2020-10-06 LAB — SARS CORONAVIRUS 2 (TAT 6-24 HRS): SARS Coronavirus 2: NEGATIVE

## 2020-10-08 ENCOUNTER — Ambulatory Visit
Admission: RE | Admit: 2020-10-08 | Discharge: 2020-10-08 | Disposition: A | Discharge status: Home / Self Care | Payer: Managed Care, Other (non HMO) | Attending: General Surgery | Admitting: General Surgery

## 2020-10-08 ENCOUNTER — Other Ambulatory Visit: Payer: Self-pay

## 2020-10-08 ENCOUNTER — Encounter
Admission: RE | Disposition: A | Discharge status: Home / Self Care | Payer: Self-pay | Source: Home / Self Care | Attending: General Surgery

## 2020-10-08 ENCOUNTER — Encounter: Payer: Self-pay | Admitting: General Surgery

## 2020-10-08 ENCOUNTER — Ambulatory Visit: Payer: Managed Care, Other (non HMO) | Admitting: Registered Nurse

## 2020-10-08 DIAGNOSIS — Z9049 Acquired absence of other specified parts of digestive tract: Secondary | ICD-10-CM | POA: Insufficient documentation

## 2020-10-08 DIAGNOSIS — F17210 Nicotine dependence, cigarettes, uncomplicated: Secondary | ICD-10-CM | POA: Insufficient documentation

## 2020-10-08 DIAGNOSIS — Z7984 Long term (current) use of oral hypoglycemic drugs: Secondary | ICD-10-CM | POA: Diagnosis not present

## 2020-10-08 DIAGNOSIS — Z1211 Encounter for screening for malignant neoplasm of colon: Secondary | ICD-10-CM | POA: Insufficient documentation

## 2020-10-08 DIAGNOSIS — Z85038 Personal history of other malignant neoplasm of large intestine: Secondary | ICD-10-CM | POA: Diagnosis present

## 2020-10-08 DIAGNOSIS — Z885 Allergy status to narcotic agent status: Secondary | ICD-10-CM | POA: Insufficient documentation

## 2020-10-08 DIAGNOSIS — Z881 Allergy status to other antibiotic agents status: Secondary | ICD-10-CM | POA: Diagnosis not present

## 2020-10-08 DIAGNOSIS — Z79899 Other long term (current) drug therapy: Secondary | ICD-10-CM | POA: Insufficient documentation

## 2020-10-08 DIAGNOSIS — D124 Benign neoplasm of descending colon: Secondary | ICD-10-CM | POA: Insufficient documentation

## 2020-10-08 DIAGNOSIS — K5730 Diverticulosis of large intestine without perforation or abscess without bleeding: Secondary | ICD-10-CM | POA: Diagnosis not present

## 2020-10-08 HISTORY — PX: COLONOSCOPY: SHX5424

## 2020-10-08 SURGERY — COLONOSCOPY
Anesthesia: General

## 2020-10-08 MED ORDER — PROPOFOL 10 MG/ML IV BOLUS
INTRAVENOUS | Status: AC
  Filled 2020-10-08: qty 20

## 2020-10-08 MED ORDER — LIDOCAINE HCL (CARDIAC) PF 100 MG/5ML IV SOSY
PREFILLED_SYRINGE | INTRAVENOUS | Status: DC | PRN
  Administered 2020-10-08: 100 mg via INTRAVENOUS

## 2020-10-08 MED ORDER — SODIUM CHLORIDE 0.9 % IV SOLN
INTRAVENOUS | Status: DC
  Administered 2020-10-08: 08:00:00 1000 mL via INTRAVENOUS

## 2020-10-08 MED ORDER — PROPOFOL 10 MG/ML IV BOLUS
INTRAVENOUS | Status: DC | PRN
  Administered 2020-10-08: 100 mg via INTRAVENOUS

## 2020-10-08 MED ORDER — PROPOFOL 500 MG/50ML IV EMUL
INTRAVENOUS | Status: DC | PRN
  Administered 2020-10-08: 150 ug/kg/min via INTRAVENOUS

## 2020-10-08 NOTE — Transfer of Care (Signed)
Immediate Anesthesia Transfer of Care Note  Patient: Stuart Rasmussen  Procedure(s) Performed: COLONOSCOPY (N/A )  Patient Location: PACU on Endoscopy Unit  Anesthesia Type:General  Level of Consciousness: drowsy  Airway & Oxygen Therapy: Patient Spontanous Breathing  Post-op Assessment: Report given to RN and Post -op Vital signs reviewed and stable  Post vital signs: Reviewed and stable  Last Vitals:  Vitals Value Taken Time  BP 117/82   Temp    Pulse 80   Resp 18   SpO2 96 % on room air     Last Pain:  Vitals:   10/08/20 0756  TempSrc: Temporal  PainSc: 0-No pain         Complications: No complications documented.

## 2020-10-08 NOTE — Anesthesia Preprocedure Evaluation (Signed)
Anesthesia Evaluation  Patient identified by MRN, date of birth, ID band Patient awake    Reviewed: Allergy & Precautions, H&P , NPO status , Patient's Chart, lab work & pertinent test results  History of Anesthesia Complications (+) PONV and history of anesthetic complications  Airway Mallampati: III  TM Distance: <3 FB Neck ROM: limited    Dental  (+) Chipped   Pulmonary sleep apnea , Current Smoker,    Pulmonary exam normal        Cardiovascular Exercise Tolerance: Good hypertension, (-) angina(-) Past MI Normal cardiovascular exam     Neuro/Psych  Headaches, PSYCHIATRIC DISORDERS negative neurological ROS     GI/Hepatic Neg liver ROS, GERD  ,  Endo/Other  diabetes, Type 2  Renal/GU Renal disease  negative genitourinary   Musculoskeletal  (+) Arthritis ,   Abdominal   Peds  Hematology negative hematology ROS (+)   Anesthesia Other Findings Past Medical History: No date: Arthritis     Comment:  knees, wrist No date: Cancer (HCC)     Comment:  HX of colon and BCC arm and nose No date: Complication of anesthesia No date: Degenerative joint disease of cervical and lumbar spine No date: Depression No date: Diabetes mellitus without complication (HCC)     Comment:  type 2 No date: GERD (gastroesophageal reflux disease) No date: Headache     Comment:  migraines occassionally No date: HOH (hard of hearing)     Comment:  mild No date: HOH (hard of hearing) No date: Hyperlipidemia No date: Hypertension No date: MGUS (monoclonal gammopathy of unknown significance) No date: PONV (postoperative nausea and vomiting) No date: Proteinuria No date: Sinus trouble     Comment:  allergies No date: Sleep apnea     Comment:  CPAP  Past Surgical History: No date: ANKLE ARTHROSCOPY 07/20/2020: CATARACT EXTRACTION W/PHACO; Left     Comment:  Procedure: CATARACT EXTRACTION PHACO AND INTRAOCULAR               LENS  PLACEMENT ([place]) LEFT DIABETIC TORIC LENS;  Surgeon:               Birder Robson, MD;  Location: [place];                Service: Ophthalmology;  Laterality: Left;  9.10 0:45.4 08/17/2020: CATARACT EXTRACTION W/PHACO; Right     Comment:  Procedure: CATARACT EXTRACTION PHACO AND INTRAOCULAR               LENS PLACEMENT ([place]) RIGHT TORIC LENS;  Surgeon:               Birder Robson, MD;  Location: [place];                Service: Ophthalmology;  Laterality: Right;  5.43 0:40.5              D 01/2011: COLON SURGERY     Comment:  partial resection 05/14/2015: COLONOSCOPY WITH PROPOFOL; N/A     Comment:  Procedure: COLONOSCOPY WITH PROPOFOL;  Surgeon: Hulen Luster, MD;  Location: [facility] ENDOSCOPY;  Service:               Gastroenterology;  Laterality: N/A; 05/24/2017: EXTRACORPOREAL SHOCK WAVE LITHOTRIPSY; Right     Comment:  Procedure: EXTRACORPOREAL SHOCK WAVE LITHOTRIPSY (ESWL);              Surgeon: Yves Dill,  Otelia Limes, MD;  Location: [facility] ORS;                Service: Urology;  Laterality: Right; No date: EYE SURGERY No date: HERNIA REPAIR     Comment:  inguinal 08/31/2015: HYDROCELE EXCISION; Left     Comment:  Procedure: HYDROCELECTOMY ADULT;  Surgeon: Royston Cowper, MD;  Location: [facility] OR[facility];  Service: Urology;                Laterality: Left; No date: PLANTAR FASCIECTOMY 01/2010: SINUSOTOMY No date: TONSILLECTOMY  BMI    Body Mass Index: 38.20 kg/m      Reproductive/Obstetrics negative OB ROS                             Anesthesia Physical Anesthesia Plan  ASA: III  Anesthesia Plan: General   Post-op Pain Management:    Induction: Intravenous  PONV Risk Score and Plan: Propofol infusion and TIVA  Airway Management Planned: Natural Airway and Nasal Cannula  Additional Equipment:   Intra-op Plan:   Post-operative Plan:   Informed Consent: I have reviewed the patients History and  Physical, chart, labs and discussed the procedure including the risks, benefits and alternatives for the proposed anesthesia with the patient or authorized representative who has indicated his/her understanding and acceptance.     Dental Advisory Given  Plan Discussed with: Anesthesiologist, CRNA and Surgeon  Anesthesia Plan Comments: (Patient consented for risks of anesthesia including but not limited to:  - adverse reactions to medications - risk of airway placement if required - damage to eyes, teeth, lips or other oral mucosa - nerve damage due to positioning  - sore throat or hoarseness - Damage to heart, brain, nerves, lungs, other parts of body or loss of life  Patient voiced understanding.)        Anesthesia Quick Evaluation

## 2020-10-08 NOTE — Op Note (Signed)
[facility] Gastroenterology Patient Name: Stuart Rasmussen Procedure Date: 10/08/2020 8:03 AM MRN: [id number] Account #: [id number] Date of Birth: June 28, 1956 Admit Type: Outpatient Age: 64 Room: [facility] ENDO ROOM 1 Gender: Male Note Status: Finalized Procedure:             Colonoscopy Indications:           High risk colon cancer surveillance: Personal history                         of colon cancer Providers:             Robert Bellow, MD Medicines:             Monitored Anesthesia Care Complications:         No immediate complications. Procedure:             Pre-Anesthesia Assessment:                        - Prior to the procedure, a History and Physical was                         performed, and patient medications, allergies and                         sensitivities were reviewed. The patient's tolerance                         of previous anesthesia was reviewed.                        - The risks and benefits of the procedure and the                         sedation options and risks were discussed with the                         patient. All questions were answered and informed                         consent was obtained.                        After obtaining informed consent, the colonoscope was                         passed under direct vision. Throughout the procedure,                         the patient's blood pressure, pulse, and oxygen                         saturations were monitored continuously. The                         Colonoscope was introduced through the anus and                         advanced to the the ileocolonic anastomosis. The  colonoscopy was performed without difficulty. The                         patient tolerated the procedure well. The quality of                         the bowel preparation was excellent. Findings:      Multiple large-mouthed diverticula were found in the recto-sigmoid        colon, sigmoid colon and descending colon.      A 5 mm polyp was found in the descending colon. The polyp was sessile.       Biopsies were taken with a cold forceps for histology.      The retroflexed view of the distal rectum and anal verge was normal and       showed no anal or rectal abnormalities. Impression:            - Diverticulosis in the recto-sigmoid colon, in the                         sigmoid colon and in the descending colon.                        - One 5 mm polyp in the descending colon. Biopsied.                        - The distal rectum and anal verge are normal on                         retroflexion view. Recommendation:        - Telephone endoscopist for pathology results in 1                         week.                        - Repeat colonoscopy in 5 years for surveillance. Procedure Code(s):     --- Professional ---                        [phone], Colonoscopy, flexible; with biopsy, single or                         multiple Diagnosis Code(s):     --- Professional ---                        [phone], Personal history of other malignant neoplasm                         of large intestine                        K63.5, Polyp of colon                        K57.30, Diverticulosis of large intestine without                         perforation or abscess without bleeding CPT copyright 2019 [employer]. All rights reserved. The codes documented  in this report are preliminary and upon coder review may  be revised to meet current compliance requirements. Robert Bellow, MD 10/08/2020 8:32:47 AM This report has been signed electronically. Number of Addenda: 0 Note Initiated On: 10/08/2020 8:03 AM Scope Withdrawal Time: 0 hours 11 minutes 9 seconds  Total Procedure Duration: 0 hours 13 minutes 58 seconds       [facility]

## 2020-10-08 NOTE — Anesthesia Postprocedure Evaluation (Signed)
Anesthesia Post Note  Patient: Stuart Rasmussen  Procedure(s) Performed: COLONOSCOPY (N/A )  Patient location during evaluation: Endoscopy Anesthesia Type: General Level of consciousness: awake and alert Pain management: pain level controlled Vital Signs Assessment: post-procedure vital signs reviewed and stable Respiratory status: spontaneous breathing, nonlabored ventilation, respiratory function stable and patient connected to nasal cannula oxygen Cardiovascular status: blood pressure returned to baseline and stable Postop Assessment: no apparent nausea or vomiting Anesthetic complications: no   No complications documented.   Last Vitals:  Vitals:   10/08/20 0900 10/08/20 0910  BP: (!) 130/95 (!) 141/90  Pulse:  66  Resp: (!) 26 (!) 24  Temp:    SpO2: 100% 100%    Last Pain:  Vitals:   10/08/20 0830  TempSrc: Temporal  PainSc:                  Precious Haws Nicklaus Alviar

## 2020-10-08 NOTE — H&P (Signed)
Stuart Rasmussen [id number] [birth year]     HPI:  64 y/o male s/p right hemicolectomy for cancer in 2012.  Negative exam in 2016. For repeat colonoscopy. Tolerated prep well.   Medications Prior to Admission  Medication Sig Dispense Refill Last Dose  . atorvastatin (LIPITOR) 10 MG tablet Take 10 mg by mouth daily.   10/07/2020 at Unknown time  . buPROPion (WELLBUTRIN XL) 150 MG 24 hr tablet Take 150 mg by mouth daily.   10/07/2020 at Unknown time  . busPIRone (BUSPAR) 15 MG tablet buspirone 15 mg tablet   10/07/2020 at Unknown time  . Cholecalciferol (VITAMIN D3) 1000 UNITS CAPS Take by mouth.   10/07/2020 at Unknown time  . DULoxetine (CYMBALTA) 30 MG capsule Take 30 mg by mouth daily.   10/07/2020 at Unknown time  . loratadine (CLARITIN) 10 MG tablet Take 10 mg by mouth daily.   10/07/2020 at Unknown time  . losartan (COZAAR) 50 MG tablet Take 50 mg by mouth daily.   10/07/2020 at Unknown time  . metFORMIN (GLUCOPHAGE) 500 MG tablet Take 1,000 mg by mouth 2 (two) times daily with a meal.    10/07/2020 at Unknown time  . metoprolol succinate (TOPROL-XL) 25 MG 24 hr tablet metoprolol succinate ER 25 mg tablet,extended release 24 hr   10/07/2020 at Unknown time  . metroNIDAZOLE (METROGEL) 1 % gel as needed.    10/07/2020 at Unknown time  . Multiple Vitamins-Minerals (MULTIVITAMIN ADULT EXTRA C PO) Take by mouth.   10/07/2020 at Unknown time  . telmisartan (MICARDIS) 20 MG tablet Take 20 mg by mouth daily.   10/07/2020 at Unknown time  . glipiZIDE (GLUCOTROL XL) 10 MG 24 hr tablet Take by mouth.     Marland Kitchen HYDROcodone-acetaminophen (NORCO) 10-325 MG per tablet Take 1 tablet by mouth every 6 (six) hours as needed.    at prn  . ibuprofen (ADVIL) 200 MG tablet Take by mouth every 6 (six) hours as needed.     at prn   Allergies  Allergen Reactions  . Morphine Nausea Only  . Morphine And Related Nausea Only  . Tetracycline Swelling  . Tetracyclines & Related Swelling   Past Medical History:   Diagnosis Date  . Arthritis    knees, wrist  . Cancer (HCC)    HX of colon and BCC arm and nose  . Complication of anesthesia   . Degenerative joint disease of cervical and lumbar spine   . Depression   . Diabetes mellitus without complication ([place])    type 2  . GERD (gastroesophageal reflux disease)   . Headache    migraines occassionally  . HOH (hard of hearing)    mild  . HOH (hard of hearing)   . Hyperlipidemia   . Hypertension   . MGUS (monoclonal gammopathy of unknown significance)   . PONV (postoperative nausea and vomiting)   . Proteinuria   . Sinus trouble    allergies  . Sleep apnea    CPAP   Past Surgical History:  Procedure Laterality Date  . ANKLE ARTHROSCOPY    . CATARACT EXTRACTION W/PHACO Left 07/20/2020   Procedure: CATARACT EXTRACTION PHACO AND INTRAOCULAR LENS PLACEMENT ([place]) LEFT DIABETIC TORIC LENS;  Surgeon: Birder Robson, MD;  Location: [place];  Service: Ophthalmology;  Laterality: Left;  9.10 0:45.4  . CATARACT EXTRACTION W/PHACO Right 08/17/2020   Procedure: CATARACT EXTRACTION PHACO AND INTRAOCULAR LENS PLACEMENT ([place]) RIGHT TORIC LENS;  Surgeon: Birder Robson, MD;  Location: [place];  Service: Ophthalmology;  Laterality: Right;  5.43 0:40.5 D  . COLON SURGERY  01/2011   partial resection  . COLONOSCOPY WITH PROPOFOL N/A 05/14/2015   Procedure: COLONOSCOPY WITH PROPOFOL;  Surgeon: Hulen Luster, MD;  Location: [facility] ENDOSCOPY;  Service: Gastroenterology;  Laterality: N/A;  . EXTRACORPOREAL SHOCK WAVE LITHOTRIPSY Right 05/24/2017   Procedure: EXTRACORPOREAL SHOCK WAVE LITHOTRIPSY (ESWL);  Surgeon: Royston Cowper, MD;  Location: [facility];  Service: Urology;  Laterality: Right;  . EYE SURGERY    . HERNIA REPAIR     inguinal  . HYDROCELE EXCISION Left 08/31/2015   Procedure: HYDROCELECTOMY ADULT;  Surgeon: Royston Cowper, MD;  Location: [facility];  Service: Urology;  Laterality: Left;  . PLANTAR FASCIECTOMY    .  SINUSOTOMY  01/2010  . TONSILLECTOMY     Social History   Socioeconomic History  . Marital status: Divorced    Spouse name: Not on file  . Number of children: Not on file  . Years of education: Not on file  . Highest education level: Not on file  Occupational History  . Not on file  Tobacco Use  . Smoking status: Light Tobacco Smoker    Packs/day: 0.25    Years: 20.00    Pack years: 5.00    Types: Cigarettes  . Smokeless tobacco: Former Systems developer    Types: Secondary school teacher  . Vaping Use: Never used  Substance and Sexual Activity  . Alcohol use: Not Currently    Comment: occasional  . Drug use: No  . Sexual activity: Not on file  Other Topics Concern  . Not on file  Social History Narrative  . Not on file   Social Determinants of Health   Financial Resource Strain: Not on file  Food Insecurity: Not on file  Transportation Needs: Not on file  Physical Activity: Not on file  Stress: Not on file  Social Connections: Not on file  Intimate Partner Violence: Not on file   Social History   Social History Narrative  . Not on file     ROS: Negative.     PE: HEENT: Negative. Lungs: Clear. Cardio: RR.   Assessment/Plan:  Proceed with planned endoscopy.   Forest Gleason [facility] 10/08/2020

## 2020-10-11 ENCOUNTER — Encounter: Payer: Self-pay | Admitting: General Surgery

## 2020-10-11 LAB — SURGICAL PATHOLOGY

## 2020-11-24 ENCOUNTER — Other Ambulatory Visit: Payer: Self-pay

## 2020-11-24 ENCOUNTER — Other Ambulatory Visit: Payer: Managed Care, Other (non HMO)

## 2020-11-24 DIAGNOSIS — Z85038 Personal history of other malignant neoplasm of large intestine: Secondary | ICD-10-CM

## 2020-11-24 DIAGNOSIS — E7800 Pure hypercholesterolemia, unspecified: Secondary | ICD-10-CM

## 2020-11-24 DIAGNOSIS — E1165 Type 2 diabetes mellitus with hyperglycemia: Secondary | ICD-10-CM

## 2020-11-24 DIAGNOSIS — I10 Essential (primary) hypertension: Secondary | ICD-10-CM

## 2020-11-24 DIAGNOSIS — IMO0002 Reserved for concepts with insufficient information to code with codable children: Secondary | ICD-10-CM

## 2020-11-24 DIAGNOSIS — Z125 Encounter for screening for malignant neoplasm of prostate: Secondary | ICD-10-CM

## 2020-11-25 LAB — URINALYSIS, ROUTINE W REFLEX MICROSCOPIC
Bilirubin, UA: NEGATIVE
Glucose, UA: NEGATIVE
Ketones, UA: NEGATIVE
Leukocytes,UA: NEGATIVE
Nitrite, UA: NEGATIVE
Protein,UA: NEGATIVE
RBC, UA: NEGATIVE
Specific Gravity, UA: 1.016 (ref 1.005–1.030)
Urobilinogen, Ur: 0.2 mg/dL (ref 0.2–1.0)
pH, UA: 5 (ref 5.0–7.5)

## 2020-11-25 LAB — COMPREHENSIVE METABOLIC PANEL
ALT: 35 IU/L (ref 0–44)
AST: 24 IU/L (ref 0–40)
Albumin/Globulin Ratio: 1.7 (ref 1.2–2.2)
Albumin: 4.5 g/dL (ref 3.8–4.8)
Alkaline Phosphatase: 63 IU/L (ref 44–121)
BUN/Creatinine Ratio: 17 (ref 10–24)
BUN: 19 mg/dL (ref 8–27)
Bilirubin Total: 0.3 mg/dL (ref 0.0–1.2)
CO2: 21 mmol/L (ref 20–29)
Calcium: 9.5 mg/dL (ref 8.6–10.2)
Chloride: 101 mmol/L (ref 96–106)
Creatinine, Ser: 1.11 mg/dL (ref 0.76–1.27)
GFR calc Af Amer: 81 mL/min/{1.73_m2} (ref >59)
GFR calc non Af Amer: 70 mL/min/{1.73_m2} (ref >59)
Globulin, Total: 2.7 g/dL (ref 1.5–4.5)
Glucose: 204 mg/dL — ABNORMAL HIGH (ref 65–99)
Potassium: 4.4 mmol/L (ref 3.5–5.2)
Sodium: 136 mmol/L (ref 134–144)
Total Protein: 7.2 g/dL (ref 6.0–8.5)

## 2020-11-25 LAB — HGB A1C W/O EAG: Hgb A1c MFr Bld: 9 % — ABNORMAL HIGH (ref 4.8–5.6)

## 2020-11-25 LAB — CBC WITH DIFFERENTIAL/PLATELET
Basophils Absolute: 0.1 10*3/uL (ref 0.0–0.2)
Basos: 1 %
EOS (ABSOLUTE): 0.2 10*3/uL (ref 0.0–0.4)
Eos: 3 %
Hematocrit: 46.8 % (ref 37.5–51.0)
Hemoglobin: 15.9 g/dL (ref 13.0–17.7)
Immature Grans (Abs): 0.1 10*3/uL (ref 0.0–0.1)
Immature Granulocytes: 2 %
Lymphocytes Absolute: 2.5 10*3/uL (ref 0.7–3.1)
Lymphs: 36 %
MCH: 31.7 pg (ref 26.6–33.0)
MCHC: 34 g/dL (ref 31.5–35.7)
MCV: 93 fL (ref 79–97)
Monocytes Absolute: 0.7 10*3/uL (ref 0.1–0.9)
Monocytes: 10 %
Neutrophils Absolute: 3.4 10*3/uL (ref 1.4–7.0)
Neutrophils: 48 %
Platelets: 221 10*3/uL (ref 150–450)
RBC: 5.01 x10E6/uL (ref 4.14–5.80)
RDW: 12.2 % (ref 11.6–15.4)
WBC: 7 10*3/uL (ref 3.4–10.8)

## 2020-11-25 LAB — TSH: TSH: 1.65 u[IU]/mL (ref 0.450–4.500)

## 2020-11-25 LAB — LIPID PANEL
Chol/HDL Ratio: 3.4 ratio (ref 0.0–5.0)
Cholesterol, Total: 134 mg/dL (ref 100–199)
HDL: 39 mg/dL — ABNORMAL LOW (ref >39)
LDL Chol Calc (NIH): 59 mg/dL (ref 0–99)
Triglycerides: 219 mg/dL — ABNORMAL HIGH (ref 0–149)
VLDL Cholesterol Cal: 36 mg/dL (ref 5–40)

## 2020-11-25 LAB — PSA TOTAL (REFLEX TO FREE): Prostate Specific Ag, Serum: 0.5 ng/mL (ref 0.0–4.0)

## 2021-02-16 ENCOUNTER — Other Ambulatory Visit: Payer: Managed Care, Other (non HMO)

## 2021-02-16 DIAGNOSIS — E785 Hyperlipidemia, unspecified: Secondary | ICD-10-CM

## 2021-02-16 DIAGNOSIS — I10 Essential (primary) hypertension: Secondary | ICD-10-CM | POA: Diagnosis not present

## 2021-02-16 DIAGNOSIS — E1129 Type 2 diabetes mellitus with other diabetic kidney complication: Secondary | ICD-10-CM

## 2021-02-16 DIAGNOSIS — Z79899 Other long term (current) drug therapy: Secondary | ICD-10-CM

## 2021-02-16 DIAGNOSIS — IMO0002 Reserved for concepts with insufficient information to code with codable children: Secondary | ICD-10-CM

## 2021-02-17 LAB — HGB A1C W/O EAG: Hgb A1c MFr Bld: 8.9 % — ABNORMAL HIGH (ref 4.8–5.6)

## 2021-02-17 LAB — LIPID PANEL
Chol/HDL Ratio: 3.3 ratio (ref 0.0–5.0)
Cholesterol, Total: 147 mg/dL (ref 100–199)
HDL: 44 mg/dL (ref >39)
LDL Chol Calc (NIH): 65 mg/dL (ref 0–99)
Triglycerides: 235 mg/dL — ABNORMAL HIGH (ref 0–149)
VLDL Cholesterol Cal: 38 mg/dL (ref 5–40)

## 2021-02-17 LAB — CARBAMAZEPINE LEVEL, TOTAL: Carbamazepine (Tegretol), S: <2 ug/mL — ABNORMAL LOW (ref 4.0–12.0)

## 2021-02-17 LAB — COMPREHENSIVE METABOLIC PANEL
ALT: 34 IU/L (ref 0–44)
AST: 27 IU/L (ref 0–40)
Albumin/Globulin Ratio: 1.9 (ref 1.2–2.2)
Albumin: 4.6 g/dL (ref 3.8–4.8)
Alkaline Phosphatase: 64 IU/L (ref 44–121)
BUN/Creatinine Ratio: 22 (ref 10–24)
BUN: 24 mg/dL (ref 8–27)
Bilirubin Total: 0.5 mg/dL (ref 0.0–1.2)
CO2: 21 mmol/L (ref 20–29)
Calcium: 9.4 mg/dL (ref 8.6–10.2)
Chloride: 98 mmol/L (ref 96–106)
Creatinine, Ser: 1.11 mg/dL (ref 0.76–1.27)
Globulin, Total: 2.4 g/dL (ref 1.5–4.5)
Glucose: 194 mg/dL — ABNORMAL HIGH (ref 65–99)
Potassium: 4.2 mmol/L (ref 3.5–5.2)
Sodium: 136 mmol/L (ref 134–144)
Total Protein: 7 g/dL (ref 6.0–8.5)
eGFR: 74 mL/min/{1.73_m2} (ref >59)

## 2021-02-17 LAB — URINALYSIS, ROUTINE W REFLEX MICROSCOPIC
Bilirubin, UA: NEGATIVE
Glucose, UA: NEGATIVE
Ketones, UA: NEGATIVE
Leukocytes,UA: NEGATIVE
Nitrite, UA: NEGATIVE
RBC, UA: NEGATIVE
Specific Gravity, UA: 1.021 (ref 1.005–1.030)
Urobilinogen, Ur: 0.2 mg/dL (ref 0.2–1.0)
pH, UA: 5 (ref 5.0–7.5)

## 2021-02-17 LAB — MICROSCOPIC EXAMINATION
Bacteria, UA: NONE SEEN
Casts: NONE SEEN /lpf
Epithelial Cells (non renal): NONE SEEN /hpf (ref 0–10)
RBC, Urine: NONE SEEN /hpf (ref 0–2)
WBC, UA: NONE SEEN /hpf (ref 0–5)

## 2021-06-01 ENCOUNTER — Other Ambulatory Visit: Payer: Managed Care, Other (non HMO)

## 2021-06-01 DIAGNOSIS — E1165 Type 2 diabetes mellitus with hyperglycemia: Secondary | ICD-10-CM | POA: Diagnosis not present

## 2021-06-01 DIAGNOSIS — E1129 Type 2 diabetes mellitus with other diabetic kidney complication: Secondary | ICD-10-CM

## 2021-06-01 DIAGNOSIS — IMO0002 Reserved for concepts with insufficient information to code with codable children: Secondary | ICD-10-CM

## 2021-06-01 DIAGNOSIS — E785 Hyperlipidemia, unspecified: Secondary | ICD-10-CM

## 2021-06-02 LAB — COMPREHENSIVE METABOLIC PANEL
ALT: 21 IU/L (ref 0–44)
AST: 21 IU/L (ref 0–40)
Albumin/Globulin Ratio: 1.6 (ref 1.2–2.2)
Albumin: 4.4 g/dL (ref 3.8–4.8)
Alkaline Phosphatase: 67 IU/L (ref 44–121)
BUN/Creatinine Ratio: 25 — ABNORMAL HIGH (ref 10–24)
BUN: 24 mg/dL (ref 8–27)
Bilirubin Total: 0.2 mg/dL (ref 0.0–1.2)
CO2: 21 mmol/L (ref 20–29)
Calcium: 9.7 mg/dL (ref 8.6–10.2)
Chloride: 101 mmol/L (ref 96–106)
Creatinine, Ser: 0.97 mg/dL (ref 0.76–1.27)
Globulin, Total: 2.7 g/dL (ref 1.5–4.5)
Glucose: 186 mg/dL — ABNORMAL HIGH (ref 65–99)
Potassium: 4.6 mmol/L (ref 3.5–5.2)
Sodium: 138 mmol/L (ref 134–144)
Total Protein: 7.1 g/dL (ref 6.0–8.5)
eGFR: 87 mL/min/{1.73_m2} (ref >59)

## 2021-06-02 LAB — HGB A1C W/O EAG: Hgb A1c MFr Bld: 8 % — ABNORMAL HIGH (ref 4.8–5.6)

## 2021-06-02 LAB — LIPID PANEL
Chol/HDL Ratio: 2.9 ratio (ref 0.0–5.0)
Cholesterol, Total: 116 mg/dL (ref 100–199)
HDL: 40 mg/dL (ref >39)
LDL Chol Calc (NIH): 50 mg/dL (ref 0–99)
Triglycerides: 153 mg/dL — ABNORMAL HIGH (ref 0–149)
VLDL Cholesterol Cal: 26 mg/dL (ref 5–40)

## 2021-06-03 LAB — URINALYSIS, ROUTINE W REFLEX MICROSCOPIC
Bilirubin, UA: NEGATIVE
Ketones, UA: NEGATIVE
Leukocytes,UA: NEGATIVE
Nitrite, UA: NEGATIVE
Protein,UA: NEGATIVE
RBC, UA: NEGATIVE
Specific Gravity, UA: 1.018 (ref 1.005–1.030)
Urobilinogen, Ur: 0.2 mg/dL (ref 0.2–1.0)
pH, UA: 5.5 (ref 5.0–7.5)

## 2021-06-03 LAB — MICROALBUMIN / CREATININE URINE RATIO
Creatinine, Urine: 82.6 mg/dL
Microalb/Creat Ratio: 34 mg/g creat — ABNORMAL HIGH (ref 0–29)
Microalbumin, Urine: 28.1 ug/mL

## 2022-01-11 ENCOUNTER — Other Ambulatory Visit: Payer: Self-pay | Admitting: Physical Medicine & Rehabilitation

## 2022-01-11 DIAGNOSIS — G8929 Other chronic pain: Secondary | ICD-10-CM

## 2022-01-25 ENCOUNTER — Ambulatory Visit
Admission: RE | Admit: 2022-01-25 | Discharge: 2022-01-25 | Disposition: A | Discharge status: Home / Self Care | Payer: Medicare HMO | Source: Ambulatory Visit | Attending: Physical Medicine & Rehabilitation | Admitting: Physical Medicine & Rehabilitation

## 2022-01-25 DIAGNOSIS — G8929 Other chronic pain: Secondary | ICD-10-CM

## 2022-01-25 DIAGNOSIS — M5442 Lumbago with sciatica, left side: Secondary | ICD-10-CM | POA: Diagnosis not present

## 2022-01-25 DIAGNOSIS — M5441 Lumbago with sciatica, right side: Secondary | ICD-10-CM | POA: Diagnosis present

## 2022-01-25 IMAGING — MR MR CERVICAL SPINE W/O CM
5 series · 36 of 48 positions shown · non-contrast
Comparison: CT neck [DATE].

CLINICAL DATA: Chronic right-sided neck pain with stiffness.
Patient reports pain increases when turning head to the right.

EXAM:
MRI CERVICAL SPINE WITHOUT CONTRAST
TECHNIQUE: Multiplanar, multisequence MR imaging of the cervical spine was
performed. No intravenous contrast was administered.

[Series 20: T2 · sagittal · 3.0mm · 0.69mm/px · 7 of 15 slices shown (1 of 2)]
[im 1/15]
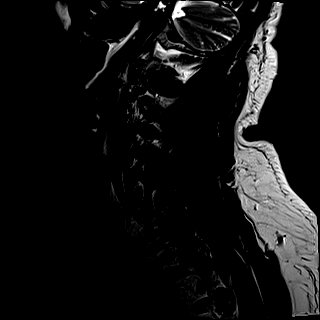
[im 3/15]
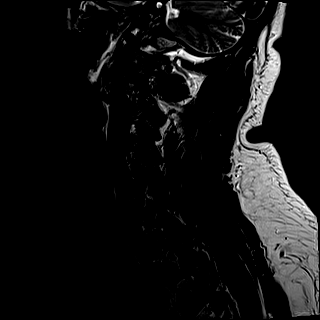
[im 5/15]
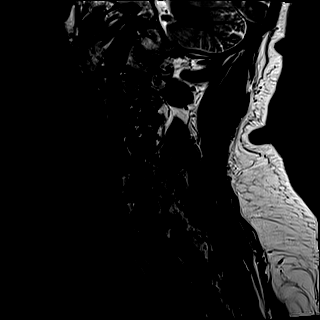
[im 8/15]
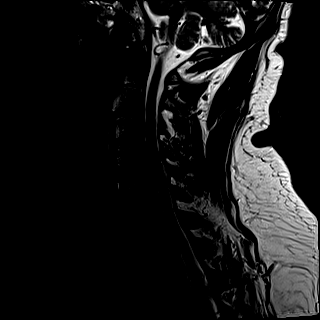
[im 10/15]
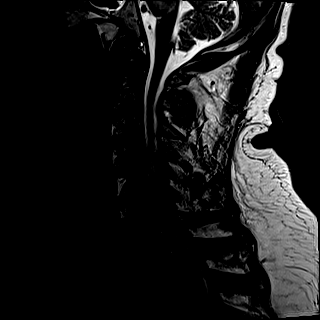
[im 12/15]
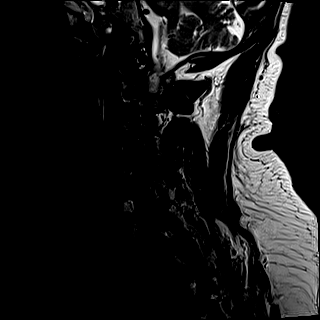
[im 15/15]
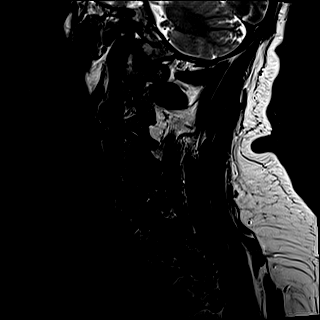

[Series 21: FLAIR · sagittal · 3.0mm · 0.86mm/px · 7 of 15 slices shown]
[im 1/15]
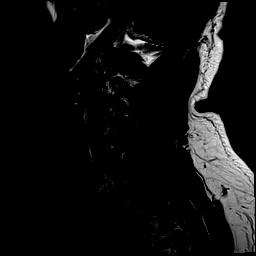
[im 3/15]
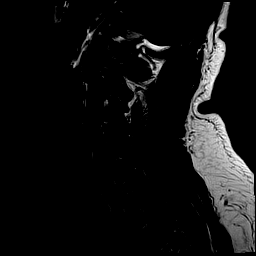
[im 5/15]
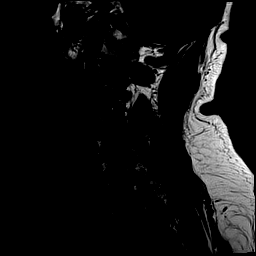
[im 8/15]
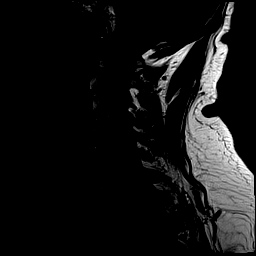
[im 10/15]
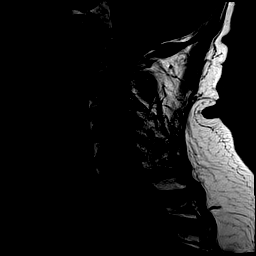
[im 12/15]
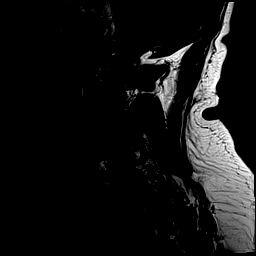
[im 15/15]
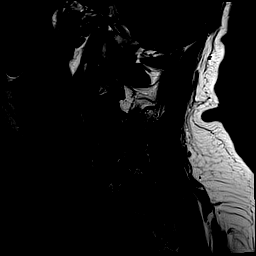

[Series 22: STIR · sagittal · 3.0mm · 0.69mm/px · 7 of 15 slices shown]
[im 1/15]
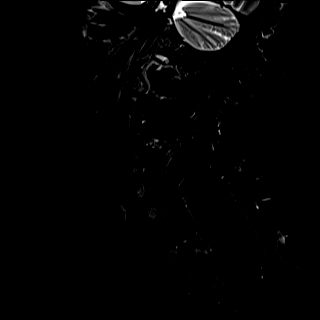
[im 3/15]
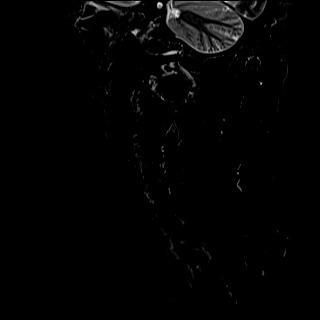
[im 5/15]
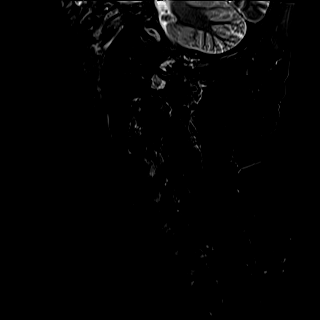
[im 8/15]
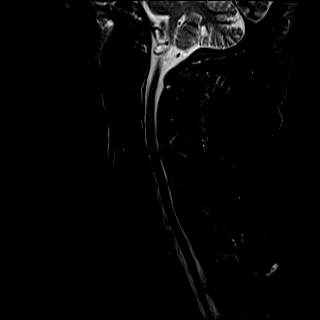
[im 10/15]
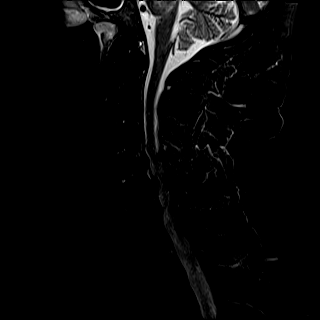
[im 12/15]
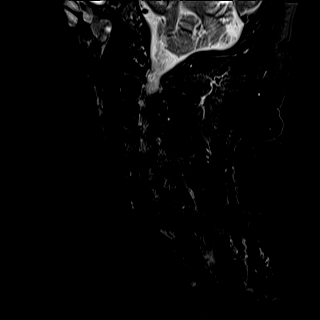
[im 15/15]
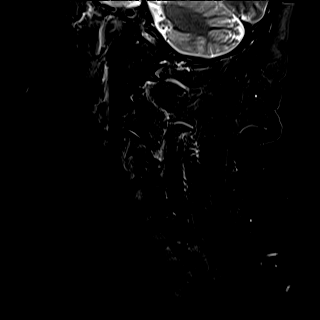

[Series 23: T2 · axial · 3.0mm · 0.70mm/px · z∈[-147,-41]mm · 8 of 32 slices shown (2 of 2)]
[im 1/32]
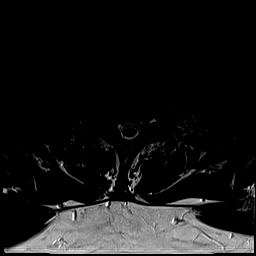
[im 5/32]
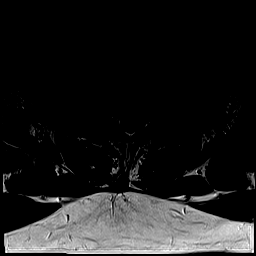
[im 10/32]
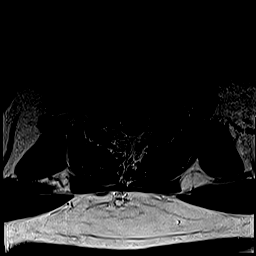
[im 15/32]
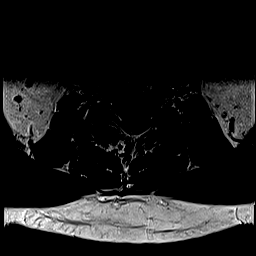
[im 17/32]
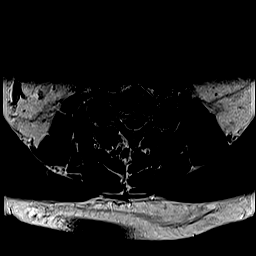
[im 22/32]
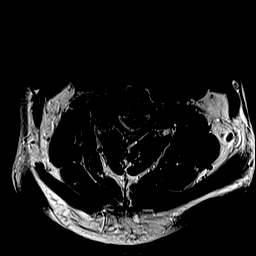
[im 27/32]
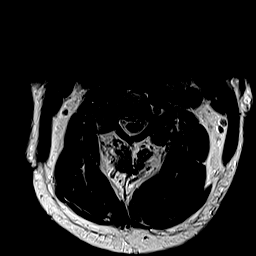
[im 32/32]
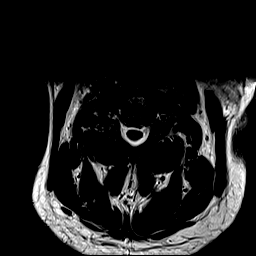

[Series 24: ax mpgr · axial · 3.0mm · 0.35mm/px · z∈[-142,-63]mm · 7 of 29 slices shown]
[im 1/29]
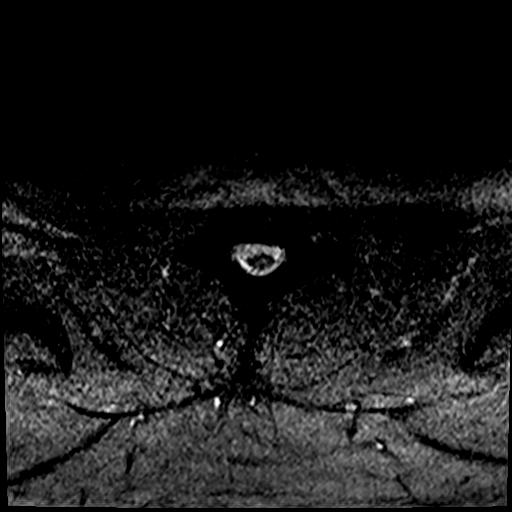
[im 5/29]
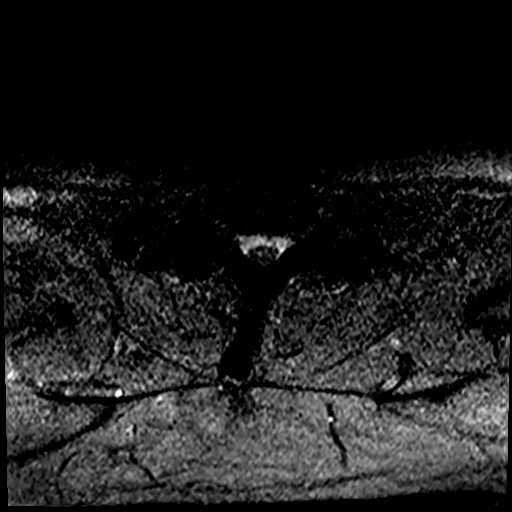
[im 10/29]
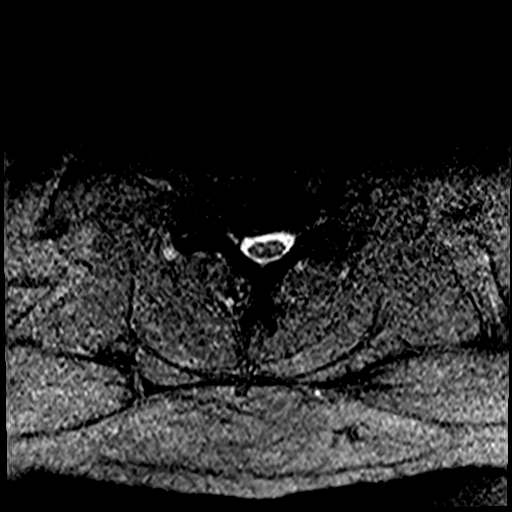
[im 12/29]
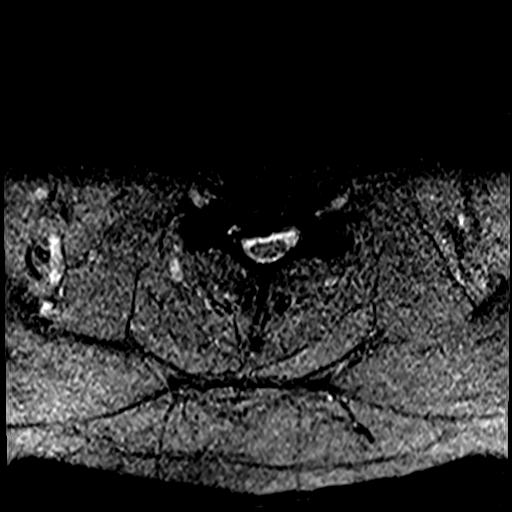
[im 17/29]
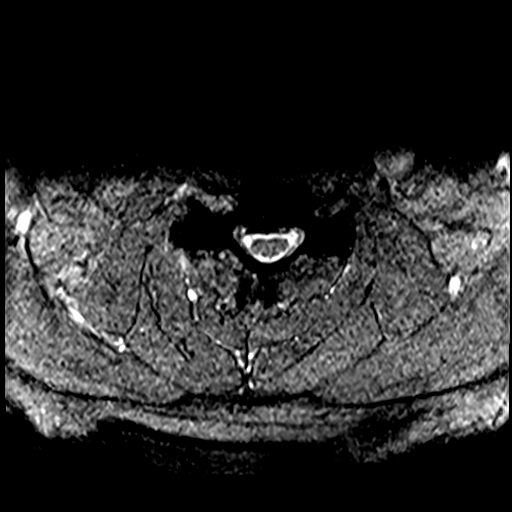
[im 19/29]
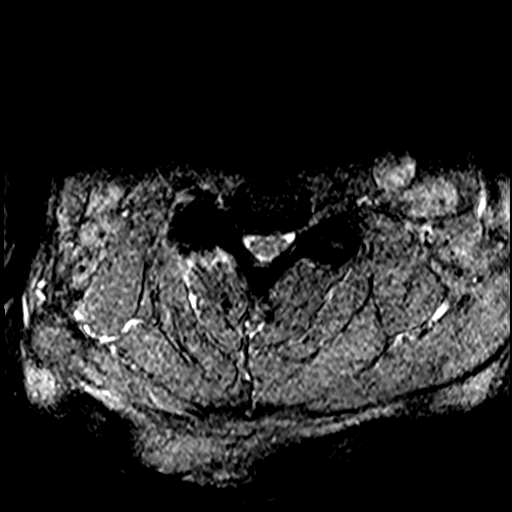
[im 24/29]
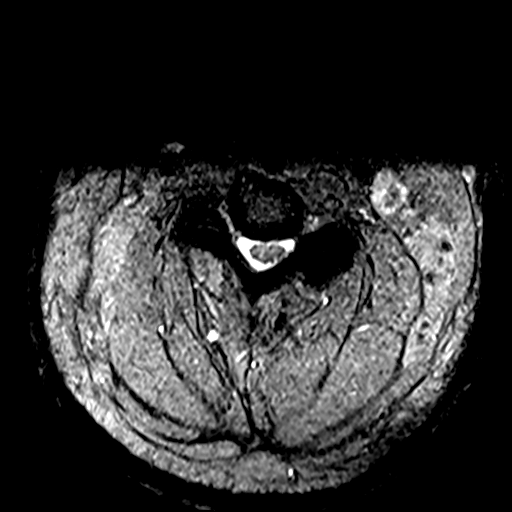

[36 of 48 positions shown; findings below may reference images not displayed]

FINDINGS: Alignment: Normal.

Vertebrae: Vertebral body heights are preserved. Background marrow
signal is within normal limits. There is no suspicious signal
abnormality. There is mild edema in the right posterior elements C3
through C5, likely degenerative in nature. There is mild surrounding
perifacetal soft tissue edema. There is also mild marrow edema in
the posterior elements on the left at T1 and bilaterally at T2 with
mild adjacent soft tissue edema. There is no other marrow edema.

Cord: Normal in signal and morphology.

Posterior Fossa, vertebral arteries, paraspinal tissues: The imaged
posterior fossa is unremarkable. The vertebral artery flow voids are
normal. The paraspinal soft tissues are unremarkable, aside from the
edema at C3-C4 described above.

Disc levels:

There is disc desiccation and narrowing at C5-C6 and C6-C7. The disc
spaces are otherwise overall preserved.

C2-C3: There is uncovertebral and bilateral facet arthropathy
resulting in mild left and no significant right neural foraminal
stenosis and no significant spinal canal stenosis

C3-C4: There is mild uncovertebral arthropathy and moderate
bilateral facet arthropathy resulting in moderate right worse than
left neural foraminal stenosis without significant spinal canal
stenosis

C4-C5: There is a posterior disc osteophyte complex with
uncovertebral ridging and moderate bilateral facet arthropathy
resulting in mild-to-moderate spinal canal stenosis and severe right
and moderate left neural foraminal stenosis

C5-C6: There is a prominent posterior disc osteophyte complex
eccentric to the right with bilateral uncovertebral ridging and
facet arthropathy resulting in moderate spinal canal stenosis and
severe right worse than left neural foraminal stenosis

C6-C7: There is a posterior disc osteophyte complex with a mild
right paracentral protrusion, uncovertebral ridging, and bilateral
facet arthropathy resulting in severe right and moderate left neural
foraminal stenosis without significant spinal canal stenosis

C7-T1: There is a small left paracentral protrusion without
significant spinal canal or neural foraminal stenosis.
IMPRESSION: 1. Multilevel facet arthropathy with mild edema in the posterior
elements on the right at C3 through C5 with surrounding perifacetal
soft tissue edema, which may reflect a source of pain. Additional
mild marrow edema in the posterior elements on the left at T1 and
bilaterally at T2 with surrounding soft tissue edema.
2. Multilevel degenerative changes detailed above resulting in
moderate right worse than left neural foraminal stenosis at C3-C4,
mild-to-moderate spinal canal stenosis and severe right and moderate
left neural foraminal stenosis at C4-C5, moderate spinal canal
stenosis and severe right worse than left neural foraminal stenosis
at C5-C6, and severe right and moderate left neural foraminal
stenosis at C6-C7.

## 2022-01-25 IMAGING — MR MR LUMBAR SPINE W/O CM
5 series · 31 of 48 positions shown · non-contrast
Comparison: None.

CLINICAL DATA: Chronic lower back pain radiating into buttocks
bilaterally. Patient reports pain increases when standing long
periods of time.

EXAM:
MRI LUMBAR SPINE WITHOUT CONTRAST
TECHNIQUE: Multiplanar, multisequence MR imaging of the lumbar spine was
performed. No intravenous contrast was administered.

[Series 20: T2 · sagittal · 4.0mm · 0.88mm/px · 7 of 17 slices shown (1 of 2)]
[im 1/17]
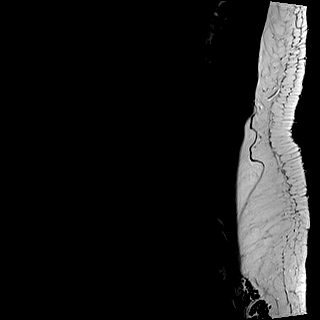
[im 3/17]
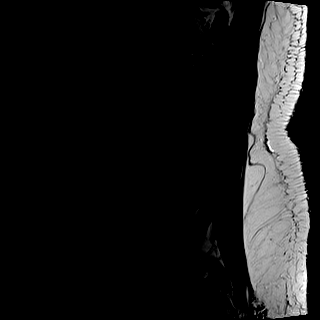
[im 6/17]
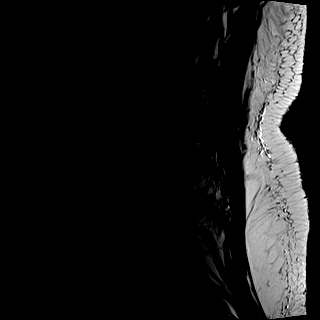
[im 9/17]
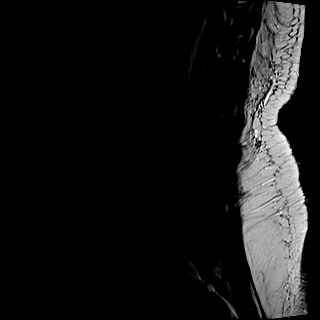
[im 11/17]
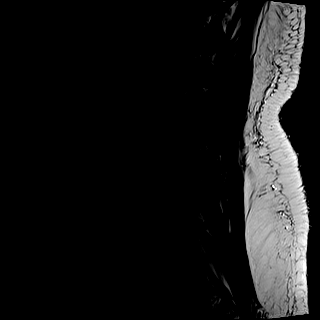
[im 14/17]
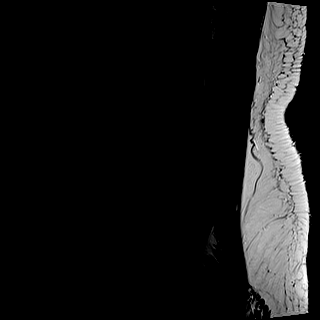
[im 17/17]
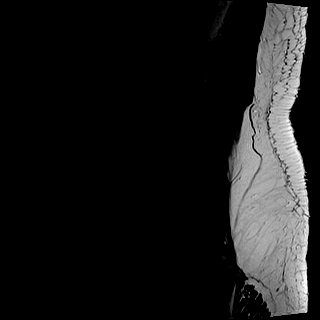

[Series 21: T1 · sagittal · 4.0mm · 0.88mm/px · 7 of 17 slices shown (1 of 2)]
[im 1/17]
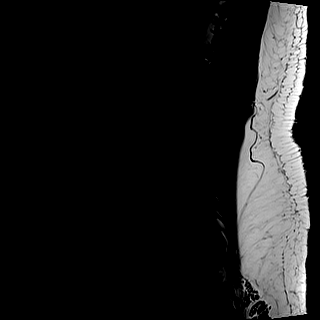
[im 3/17]
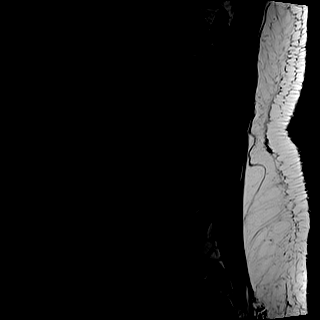
[im 6/17]
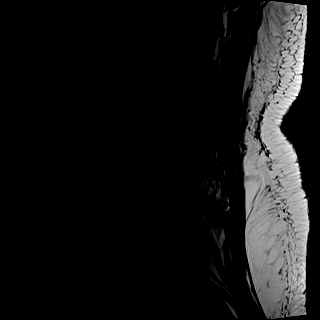
[im 9/17]
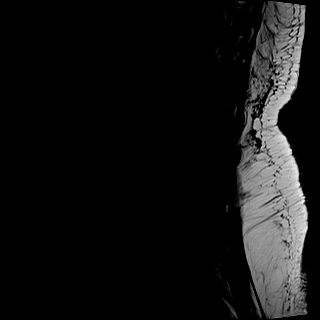
[im 11/17]
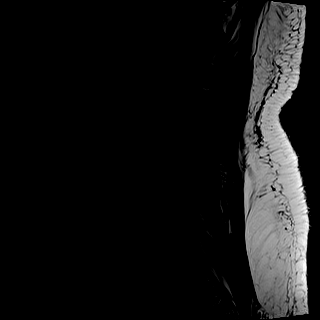
[im 14/17]
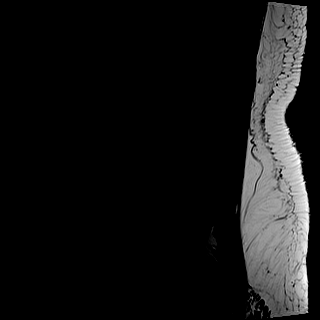
[im 17/17]
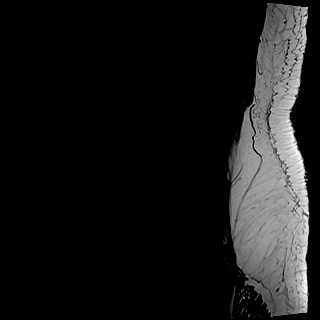

[Series 22: STIR · sagittal · 4.0mm · 0.44mm/px · 1 of 17 slices shown]
[im 1/17]
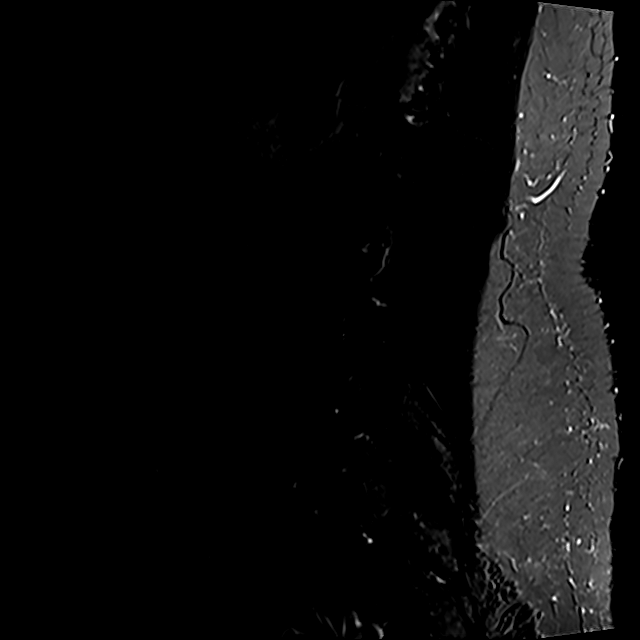

[Series 23: T2 · axial · 4.0mm · 0.78mm/px · z∈[-635,-407]mm · 8 of 38 slices shown (2 of 2)]
[im 1/38]
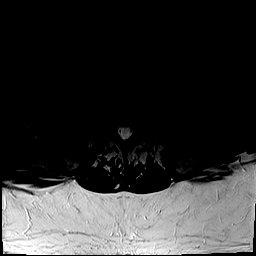
[im 6/38]
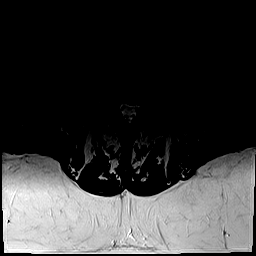
[im 12/38]
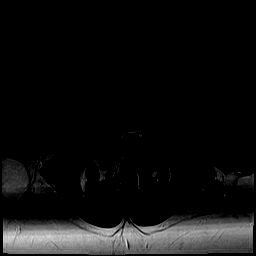
[im 18/38]
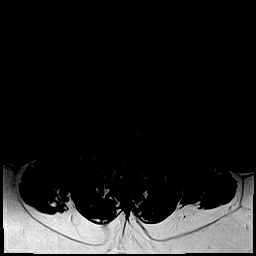
[im 20/38]
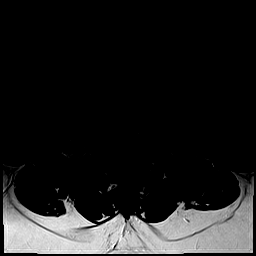
[im 26/38]
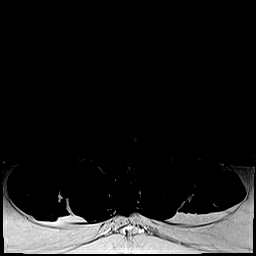
[im 32/38]
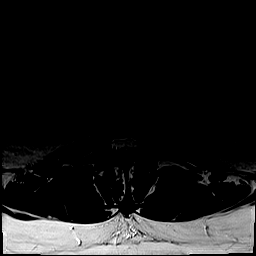
[im 38/38]
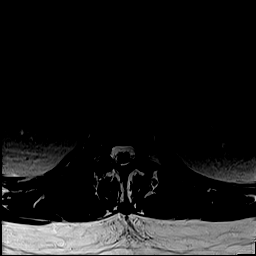

[Series 24: T1 · axial · 4.0mm · 0.39mm/px · z∈[-635,-407]mm · 8 of 38 slices shown (2 of 2)]
[im 1/38]
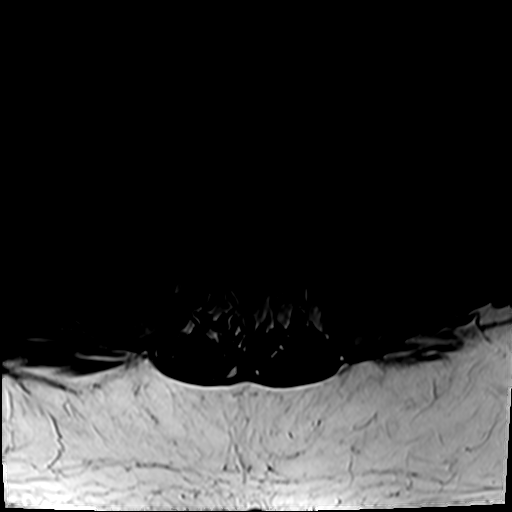
[im 6/38]
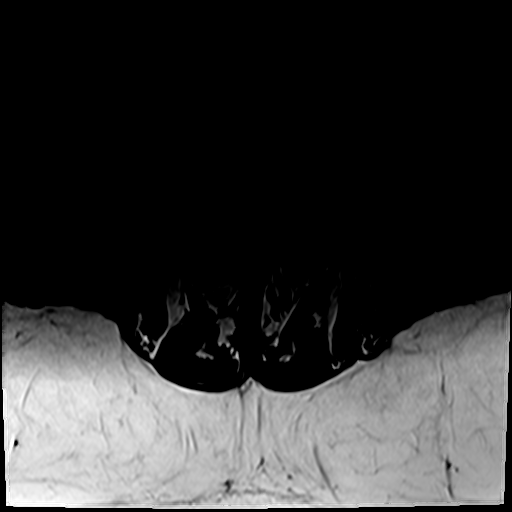
[im 12/38]
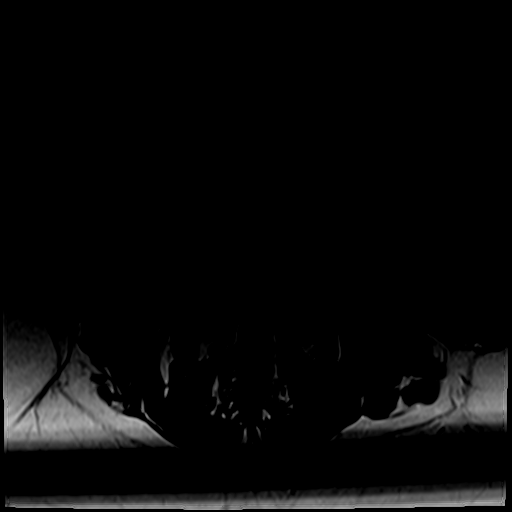
[im 18/38]
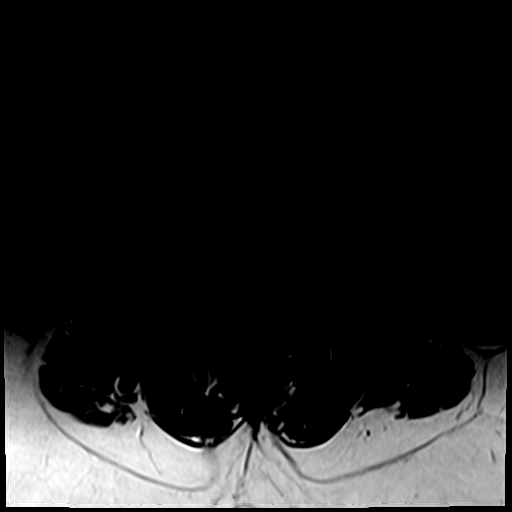
[im 20/38]
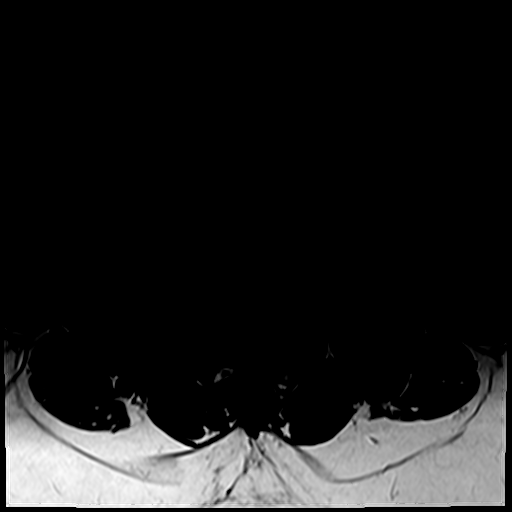
[im 26/38]
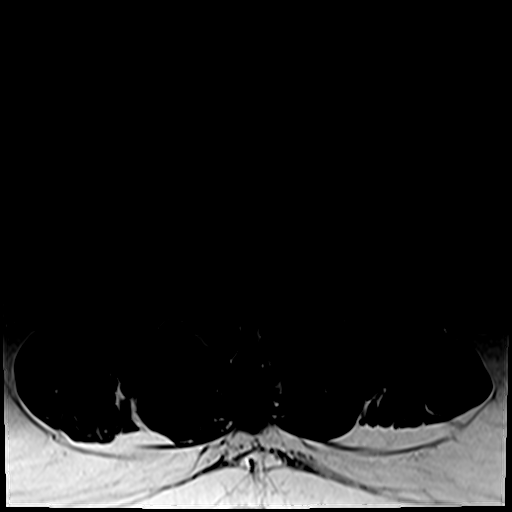
[im 32/38]
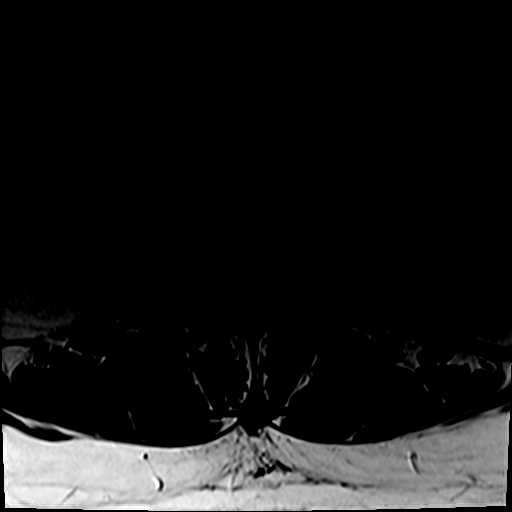
[im 38/38]
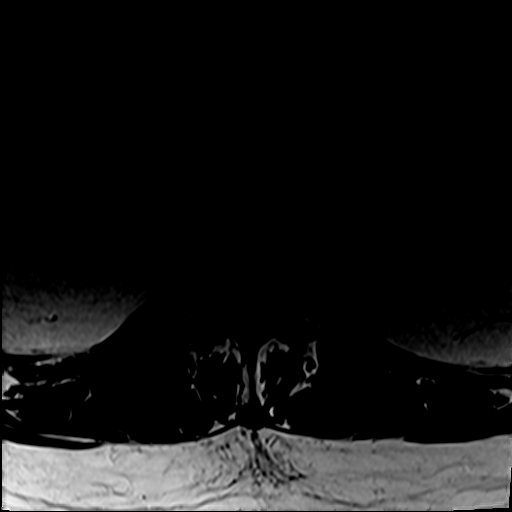

[31 of 48 positions shown; findings below may reference images not displayed]

FINDINGS: Segmentation:  Standard.

Alignment:  Physiologic.

Vertebrae: No acute fracture, evidence of discitis, or aggressive
bone lesion.

Conus medullaris and cauda equina: Conus extends to the T12-L1
level. Conus and cauda equina appear normal.

Paraspinal and other soft tissues: No acute paraspinal abnormality.
Stable right renal cysts partially visualized.

Disc levels:

Disc spaces: Degenerative disease with disc height loss at T12-L1,
L2-3 and L4-5.

T12-L1: Mild broad-based disc bulge. Mild bilateral facet
arthropathy, right greater than left. No foraminal or central canal
stenosis.

L1-L2: Mild broad-based disc bulge. Mild bilateral facet
arthropathy. No foraminal or central canal stenosis.

L2-L3: Mild broad-based disc bulge with a left paracentral disc
extrusion with cephalad migration of disc material contacting the
left L2 nerve root and mass effect on the left intraspinal L3 nerve
root. Moderate bilateral facet arthropathy with ligamentum flavum
infolding. Moderate-severe spinal stenosis. No foraminal stenosis.

L3-L4: Broad-based disc bulge flattening the ventral thecal sac.
Moderate bilateral facet arthropathy with ligamentum flavum
infolding. Severe spinal stenosis. Mild bilateral foraminal
stenosis.

L4-L5: Broad-based disc bulge flattening the ventral thecal sac.
Moderate bilateral facet arthropathy with ligamentum flavum
infolding. Severe spinal stenosis. Mild-moderate right foraminal
stenosis. Mild left foraminal stenosis. Bilateral subarticular
recess stenosis, right greater than left.

L5-S1: Broad-based disc bulge with a small central disc protrusion
and a left foraminal disc protrusion. Moderate-severe left foraminal
stenosis. No right foraminal stenosis. Mild bilateral facet
arthropathy. No spinal stenosis.
IMPRESSION: 1. At L2-3 there is a mild broad-based disc bulge with a left
paracentral disc extrusion with cephalad migration of disc material
contacting the left L2 nerve root and mass effect on the left
intraspinal L3 nerve root. Moderate bilateral facet arthropathy with
ligamentum flavum infolding. Moderate-severe spinal stenosis.
2. At L3-4 there is a broad-based disc bulge flattening the ventral
thecal sac. Moderate bilateral facet arthropathy with ligamentum
flavum infolding. Severe spinal stenosis. Mild bilateral foraminal
stenosis.
3. At L4-5 there is a broad-based disc bulge flattening the ventral
thecal sac. Moderate bilateral facet arthropathy with ligamentum
flavum infolding. Severe spinal stenosis. Mild-moderate right
foraminal stenosis. Mild left foraminal stenosis. Bilateral
subarticular recess stenosis, right greater than left.
4. At L5-S1 there is a broad-based disc bulge with a small central
disc protrusion and a left foraminal disc protrusion.
Moderate-severe left foraminal stenosis. No right foraminal
stenosis. Mild bilateral facet arthropathy.
5.  No acute osseous injury of the lumbar spine.

## 2022-11-13 ENCOUNTER — Other Ambulatory Visit: Payer: Self-pay | Admitting: Internal Medicine

## 2022-11-13 DIAGNOSIS — M4802 Spinal stenosis, cervical region: Secondary | ICD-10-CM

## 2022-11-29 ENCOUNTER — Ambulatory Visit
Admission: RE | Admit: 2022-11-29 | Discharge: 2022-11-29 | Disposition: A | Discharge status: Home / Self Care | Payer: Medicare HMO | Source: Ambulatory Visit | Attending: Internal Medicine | Admitting: Internal Medicine

## 2022-11-29 DIAGNOSIS — M4802 Spinal stenosis, cervical region: Secondary | ICD-10-CM | POA: Insufficient documentation

## 2022-12-18 NOTE — Progress Notes (Unsigned)
Referring Physician:  Adin Hector, MD [facility] [place],  [place] [address]  Primary Physician:  Stuart Hector, MD  History of Present Illness: 12/19/2022 [name] is here today with a chief complaint of right sided neck pain.  He has pain into his right shoulder blade with rotation of his neck.  He has had pain for 2 years and is getting worse.  He sometimes has pain that extends towards his arm.  Driving and turning his neck make his pain worse.  Heat and medication made it better.  He denies falls.  He reports constant pain.  He has had injections which have helped for short periods of time.  Without radiation into the arms.    Bowel/Bladder Dysfunction: none  Conservative measures:  Physical therapy:  has done some home exercises but no formal physical therapy Multimodal medical therapy including regular antiinflammatories: hydrocodone, duloxetine, ibuprofen  Injections: has received epidural steroid injections 12/04/22: Right C5-6 TF ESI 05/18/22: Right C5-6 TF ESI (50% relief)   Past Surgery: denies  Stuart Rasmussen has some symptoms of cervical myelopathy.  The symptoms are causing a significant impact on the patient's life.   I have utilized the care everywhere function in epic to review the outside records available from external health systems.  Review of Systems:  A 10 point review of systems is negative, except for the pertinent positives and negatives detailed in the HPI.  Past Medical History: Past Medical History:  Diagnosis Date   Arthritis    knees, wrist   Cancer (HCC)    HX of colon and BCC arm and nose   Complication of anesthesia    Degenerative joint disease of cervical and lumbar spine    Depression    Diabetes mellitus without complication (HCC)    type 2   GERD (gastroesophageal reflux disease)    Headache    migraines occassionally   HOH (hard of hearing)    mild   HOH (hard of hearing)     Hyperlipidemia    Hypertension    MGUS (monoclonal gammopathy of unknown significance)    PONV (postoperative nausea and vomiting)    Proteinuria    Sinus trouble    allergies   Sleep apnea    CPAP    Past Surgical History: Past Surgical History:  Procedure Laterality Date   ANKLE ARTHROSCOPY     CATARACT EXTRACTION W/PHACO Left 07/20/2020   Procedure: CATARACT EXTRACTION PHACO AND INTRAOCULAR LENS PLACEMENT ([place]) LEFT DIABETIC TORIC LENS;  Surgeon: Birder Robson, MD;  Location: [place];  Service: Ophthalmology;  Laterality: Left;  9.10 0:45.4   CATARACT EXTRACTION W/PHACO Right 08/17/2020   Procedure: CATARACT EXTRACTION PHACO AND INTRAOCULAR LENS PLACEMENT ([place]) RIGHT TORIC LENS;  Surgeon: Birder Robson, MD;  Location: [place];  Service: Ophthalmology;  Laterality: Right;  5.43 0:40.5 D   COLON SURGERY  01/2011   partial resection   COLONOSCOPY N/A 10/08/2020   Procedure: COLONOSCOPY;  Surgeon: Robert Bellow, MD;  Location: [facility] ENDOSCOPY;  Service: Endoscopy;  Laterality: N/A;   COLONOSCOPY WITH PROPOFOL N/A 05/14/2015   Procedure: COLONOSCOPY WITH PROPOFOL;  Surgeon: Hulen Luster, MD;  Location: [facility] ENDOSCOPY;  Service: Gastroenterology;  Laterality: N/A;   EXTRACORPOREAL SHOCK WAVE LITHOTRIPSY Right 05/24/2017   Procedure: EXTRACORPOREAL SHOCK WAVE LITHOTRIPSY (ESWL);  Surgeon: Royston Cowper, MD;  Location: [facility] ORS;  Service: Urology;  Laterality: Right;   EYE SURGERY  HERNIA REPAIR     inguinal   HYDROCELE EXCISION Left 08/31/2015   Procedure: HYDROCELECTOMY ADULT;  Surgeon: Royston Cowper, MD;  Location: [facility] OR[facility];  Service: Urology;  Laterality: Left;   PLANTAR FASCIECTOMY     SINUSOTOMY  01/2010   TONSILLECTOMY      Allergies: Allergies as of 12/19/2022 - Review Complete 12/19/2022  Allergen Reaction Noted   Morphine Nausea Only 05/10/2014   Morphine and related Nausea Only 05/13/2015   Tetracycline Swelling 05/10/2014    Tetracyclines & related Swelling 05/13/2015    Medications: Current Meds  Medication Sig   atorvastatin (LIPITOR) 10 MG tablet Take 10 mg by mouth daily.   buPROPion (WELLBUTRIN XL) 150 MG 24 hr tablet Take 150 mg by mouth daily.   busPIRone (BUSPAR) 15 MG tablet buspirone 15 mg tablet   Cholecalciferol (VITAMIN D3) 1000 UNITS CAPS Take by mouth.   DULoxetine (CYMBALTA) 30 MG capsule Take 30 mg by mouth daily.   HYDROcodone-acetaminophen (NORCO) 10-325 MG per tablet Take 1 tablet by mouth every 6 (six) hours as needed.   ibuprofen (ADVIL) 200 MG tablet Take by mouth every 6 (six) hours as needed.    loratadine (CLARITIN) 10 MG tablet Take 10 mg by mouth daily.   losartan (COZAAR) 50 MG tablet Take 50 mg by mouth daily.   metFORMIN (GLUCOPHAGE) 500 MG tablet Take 1,000 mg by mouth 2 (two) times daily with a meal.    metoprolol succinate (TOPROL-XL) 25 MG 24 hr tablet metoprolol succinate ER 25 mg tablet,extended release 24 hr   metroNIDAZOLE (METROGEL) 1 % gel as needed.    Multiple Vitamins-Minerals (MULTIVITAMIN ADULT EXTRA C PO) Take by mouth.   telmisartan (MICARDIS) 20 MG tablet Take 20 mg by mouth daily.    Social History: Social History   Tobacco Use   Smoking status: Light Smoker    Packs/day: 0.25    Years: 20.00    Total pack years: 5.00    Types: Cigarettes   Smokeless tobacco: Former    Types: Nurse, children's Use: Never used  Substance Use Topics   Alcohol use: Not Currently    Comment: occasional   Drug use: No    Family Medical History: No family history on file.  Physical Examination: Vitals:   12/19/22 1016  BP: (!) 140/80    General: Patient is well developed, well nourished, calm, collected, and in no apparent distress. Attention to examination is appropriate.  Neck:   Supple.  Full range of motion with pain into shoulder blade and trapezius on rotation.  Respiratory: Patient is breathing without any difficulty.   NEUROLOGICAL:      Awake, alert, oriented to person, place, and time.  Speech is clear and fluent.   Cranial Nerves: Pupils equal round and reactive to light.  Facial tone is symmetric.  Facial sensation is symmetric. Shoulder shrug is symmetric. Tongue protrusion is midline.  There is no pronator drift.  ROM of spine: full.    Strength: Side Biceps Triceps Deltoid Interossei Grip Wrist Ext. Wrist Flex.  R 5 4+ 4+ '5 5 5 5  '$ L '5 5 5 5 5 5 5   '$ Side Iliopsoas Quads Hamstring PF DF EHL  R '5 5 5 5 5 5  '$ L '5 5 5 5 5 5   '$ Reflexes are 2+ and symmetric at the biceps, triceps, brachioradialis, patella and achilles.   Hoffman's is present.   Bilateral upper and lower extremity sensation is  intact to light touch.    No evidence of dysmetria noted.  Gait is wide-based. Moderate difficulty with tandem gait.     Medical Decision Making  Imaging: MRI C spine 11/29/2022 Disc levels:   C2-C3: No disc protrusion. Bilateral facet and uncovertebral arthropathy contributing to mild-moderate left and mild right foraminal stenosis. No canal stenosis. No significant interval progression.   C3-C4: Disc osteophyte complex, eccentric to the right, with bilateral facet and uncovertebral arthropathy. Moderate right and mild-moderate left foraminal stenosis. Mild canal stenosis. No significant interval progression.   C4-C5: Disc osteophyte complex with bilateral facet and uncovertebral arthropathy. Severe right and moderate left foraminal stenosis with moderate-severe canal stenosis. No significant interval progression.   C5-C6: Disc osteophyte complex, eccentric to the right, with bilateral facet and uncovertebral arthropathy. Moderate to severe canal stenosis with moderate to severe bilateral foraminal stenosis. No significant interval progression.   C6-C7: Disc osteophyte complex with bilateral facet and uncovertebral arthropathy. Mild-to-moderate canal stenosis with moderate to severe bilateral foraminal  stenosis. No significant interval progression.   C7-T1: Disc uncovering with shallow left paracentral disc protrusion. Bilateral facet arthropathy. Mild canal stenosis. No foraminal stenosis. No significant interval progression.   IMPRESSION: 1. Multilevel cervical spondylosis, not significantly progressed from prior MRI of 01/25/2022. 2. Moderate-severe canal stenosis at C4-5 and C5-6. Mild-to-moderate canal stenosis at C6-7. 3. Multilevel foraminal stenosis, severe on the right at C4-5 and moderate-to-severe bilaterally at C5-6 and C6-7. 4. Mild reactive marrow edema associated with the right C3-4 facet joint, which may be a focal source of pain.     Electronically Signed   By: Davina Poke D.O.   On: 11/30/2022 10:57  I have personally reviewed the images and agree with the above interpretation.  Assessment and Plan: Mr. Toler is a pleasant 67 y.o. male with severe cervical stenosis with symptoms concerning for cervical radiculopathy as he has recapitulation of pain with rotation and tilting his head towards the right.  He has abnormal gait and elevated reflexes.  Based on his symptoms of cervical myelopathy, I have recommended against conservative management.  If conservative management is not indicated for cervical myelopathy in the setting of severe central canal stenosis.  I recommended C4-7 anterior cervical discectomy and fusion.  I discussed the planned procedure at length with the patient, including the risks, benefits, alternatives, and indications. The risks discussed include but are not limited to bleeding, infection, need for reoperation, spinal fluid leak, stroke, vision loss, anesthetic complication, coma, paralysis, and even death. We also discussed the possibility of post-operative dysphagia, vocal cord paralysis, and the risk of adjacent segment disease in the future. I also described in detail that improvement was not guaranteed.  The patient expressed  understanding of these risks, and asked that we proceed with surgery. I described the surgery in layman's terms, and gave ample opportunity for questions, which were answered to the best of my ability.  If he still has neck pain due to his facet arthropathy above these levels after he recovers, we may consider medial branch block and RFA with Dr. Alba Destine per her plan from today.   Thank you for involving me in the care of this patient.      Shiheem Corporan K. Izora Ribas MD, [facility] Neurosurgery

## 2022-12-19 ENCOUNTER — Encounter: Payer: Self-pay | Admitting: Neurosurgery

## 2022-12-19 ENCOUNTER — Ambulatory Visit: Payer: Medicare HMO | Admitting: Neurosurgery

## 2022-12-19 VITALS — BP 140/80 | Ht 73.5 in | Wt 313.8 lb

## 2022-12-19 DIAGNOSIS — M4802 Spinal stenosis, cervical region: Secondary | ICD-10-CM

## 2022-12-19 DIAGNOSIS — M5412 Radiculopathy, cervical region: Secondary | ICD-10-CM

## 2022-12-19 DIAGNOSIS — G959 Disease of spinal cord, unspecified: Secondary | ICD-10-CM

## 2022-12-19 NOTE — Patient Instructions (Signed)
Please see below for information in regards to your upcoming surgery:  **we are planning to move our office location mid-March. Please check with [place] to see if we have moved to our new location prior to coming to your post op appointments after surgery. Our phone number will remain the same [phone]). Our new address will be: [facility] [address] [place], [place] [address]  Planned surgery: C4-7 anterior cervical discectomy and fusion   Surgery date: 01/29/23 - you will find out your arrival time the business day before your surgery.   Pre-op appointment at [facility]: we will call you with a date/time for this. Pre-admit testing is located on the first floor of the [facility], [place], [address]. Please bring all prescriptions in the original prescription bottles to your appointment, even if you have reviewed medications by phone with a pharmacy representative. Pre-op labs may be done at your pre-op appointment. You are not required to fast for these labs. Should you need to change your pre-op appointment, please call Pre-admit testing at [phone].    Surgical clearance: we will send a clearance request to Dr Caryl Comes     Brace: [facility] will contact you regarding an appointment for the brace you will use after surgery. Their number is [phone] should you miss their call or have an issue with your brace after surgery. You will need to bring the brace to the hospital on the day of surgery.    Metformin - should be held for 2 days prior to surgery    NSAIDS (Non-steroidal anti-inflammatory drugs): because you are having a fusion, no NSAIDS (such as ibuprofen, aleve, naproxen, meloxicam, diclofenac) for 3 months after surgery. Celebrex is an exception. Tylenol is ok because it is not an NSAID.   Because you are having a fusion: for appointments after your 2 week follow-up: please arrive  at the [facility] outpatient imaging center ([place], [place]) or [facility] one hour prior to your appointment for x-rays. This applies to every appointment after your 2 week follow-up. Failure to do so may result in your appointment being rescheduled.   If you have FMLA/disability paperwork, please drop it off or fax it to [phone], attention [place].   We can be reached by phone or mychart 8am-4pm, Monday-Friday. If you have any questions/concerns before or after surgery, you can reach [place] at [phone], or you can send a mychart message. If you have a concern after hours that cannot wait until normal business hours, you can call [phone] and ask to page the neurosurgeon on call for [place].      Appointments/FMLA & disability paperwork: [place]  Nurse: Ophelia Shoulder  Medical assistants: Lum Keas Physician Assistant's: [place] Surgeon: Meade Maw, MD

## 2023-01-01 ENCOUNTER — Other Ambulatory Visit: Payer: Self-pay

## 2023-01-01 DIAGNOSIS — Z01818 Encounter for other preprocedural examination: Secondary | ICD-10-CM

## 2023-01-02 ENCOUNTER — Encounter: Payer: Self-pay | Admitting: Neurosurgery

## 2023-01-16 ENCOUNTER — Other Ambulatory Visit: Payer: Medicare HMO

## 2023-01-29 ENCOUNTER — Inpatient Hospital Stay: Admit: 2023-01-29 | Payer: Medicare HMO | Admitting: Neurosurgery

## 2023-01-29 SURGERY — ANTERIOR CERVICAL DECOMPRESSION/DISCECTOMY FUSION 3 LEVELS
Anesthesia: General

## 2023-02-15 ENCOUNTER — Encounter: Payer: Medicare HMO | Admitting: Neurosurgery

## 2023-03-22 ENCOUNTER — Encounter: Payer: Medicare HMO | Admitting: Neurosurgery

## 2023-04-19 ENCOUNTER — Encounter: Payer: Medicare HMO | Admitting: Neurosurgery

## 2023-10-25 ENCOUNTER — Other Ambulatory Visit: Payer: Self-pay | Admitting: Spine Surgery

## 2023-10-25 DIAGNOSIS — M5090 Cervical disc disorder, unspecified, unspecified cervical region: Secondary | ICD-10-CM

## 2023-11-02 ENCOUNTER — Ambulatory Visit
Admission: RE | Admit: 2023-11-02 | Discharge: 2023-11-02 | Disposition: A | Discharge status: Home / Self Care | Payer: Medicare HMO | Source: Ambulatory Visit | Attending: Spine Surgery | Admitting: Spine Surgery

## 2023-11-02 DIAGNOSIS — M5090 Cervical disc disorder, unspecified, unspecified cervical region: Secondary | ICD-10-CM | POA: Diagnosis present
